# Patient Record
Sex: Male | Born: 1985 | Race: White | Hispanic: No | Marital: Married | Smoking: Former smoker
Health system: Southern US, Community
[De-identification: ages and names within clinical notes are randomized; demographics above are authoritative.]

## PROBLEM LIST (undated history)

## (undated) DIAGNOSIS — E7800 Pure hypercholesterolemia, unspecified: Secondary | ICD-10-CM

---

## 2011-03-03 HISTORY — PX: FRACTURE SURGERY: SHX138

## 2011-10-11 ENCOUNTER — Emergency Department: Payer: Self-pay | Admitting: Emergency Medicine

## 2012-12-14 ENCOUNTER — Encounter (INDEPENDENT_AMBULATORY_CARE_PROVIDER_SITE_OTHER): Payer: Self-pay

## 2012-12-14 ENCOUNTER — Encounter: Payer: Self-pay | Admitting: Internal Medicine

## 2012-12-14 ENCOUNTER — Ambulatory Visit (INDEPENDENT_AMBULATORY_CARE_PROVIDER_SITE_OTHER): Payer: PRIVATE HEALTH INSURANCE | Admitting: Internal Medicine

## 2012-12-14 VITALS — BP 116/66 | HR 78 | Temp 98.0°F | Resp 12 | Ht 71.0 in | Wt 190.0 lb

## 2012-12-14 DIAGNOSIS — R1011 Right upper quadrant pain: Secondary | ICD-10-CM

## 2012-12-14 DIAGNOSIS — Z0000 Encounter for general adult medical examination without abnormal findings: Secondary | ICD-10-CM

## 2012-12-14 DIAGNOSIS — Z113 Encounter for screening for infections with a predominantly sexual mode of transmission: Secondary | ICD-10-CM

## 2012-12-14 DIAGNOSIS — R160 Hepatomegaly, not elsewhere classified: Secondary | ICD-10-CM

## 2012-12-14 DIAGNOSIS — R5381 Other malaise: Secondary | ICD-10-CM

## 2012-12-14 DIAGNOSIS — Z23 Encounter for immunization: Secondary | ICD-10-CM

## 2012-12-14 DIAGNOSIS — Z13220 Encounter for screening for lipoid disorders: Secondary | ICD-10-CM
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
## 2012-12-14 NOTE — Patient Instructions (Signed)
We will contact you with your lab results, either by phone or via [employer] will set up your ultrasound

## 2012-12-15 LAB — COMPREHENSIVE METABOLIC PANEL
ALT: 28 U/L (ref 0–53)
AST: 29 U/L (ref 0–37)
Albumin: 4.7 g/dL (ref 3.5–5.2)
BUN: 14 mg/dL (ref 6–23)
CO2: 29 mEq/L (ref 19–32)
Calcium: 9.3 mg/dL (ref 8.4–10.5)
Chloride: 100 mEq/L (ref 96–112)
GFR: 94.57 mL/min (ref >60.00)
Potassium: 4.2 mEq/L (ref 3.5–5.1)
Sodium: 139 mEq/L (ref 135–145)
Total Protein: 7.7 g/dL (ref 6.0–8.3)

## 2012-12-15 LAB — CBC WITH DIFFERENTIAL/PLATELET
Basophils Relative: 0.2 % (ref 0.0–3.0)
Eosinophils Relative: 3.9 % (ref 0.0–5.0)
HCT: 48.2 % (ref 39.0–52.0)
Hemoglobin: 16.6 g/dL (ref 13.0–17.0)
Lymphocytes Relative: 37.7 % (ref 12.0–46.0)
Lymphs Abs: 3.5 10*3/uL (ref 0.7–4.0)
Monocytes Relative: 6.2 % (ref 3.0–12.0)
Neutro Abs: 4.8 10*3/uL (ref 1.4–7.7)
RBC: 5.59 Mil/uL (ref 4.22–5.81)

## 2012-12-15 LAB — TSH: TSH: 2.04 u[IU]/mL (ref 0.35–5.50)

## 2012-12-15 LAB — HIV ANTIBODY (ROUTINE TESTING W REFLEX): HIV: NONREACTIVE

## 2012-12-15 LAB — HEPATITIS C ANTIBODY: HCV Ab: NEGATIVE

## 2012-12-16 LAB — HSV(HERPES SIMPLEX VRS) I + II AB-IGG
HSV 1 Glycoprotein G Ab, IgG: <0.1 IV
HSV 2 Glycoprotein G Ab, IgG: <0.1 IV

## 2012-12-17 ENCOUNTER — Encounter: Payer: Self-pay | Admitting: Internal Medicine

## 2012-12-17 DIAGNOSIS — R160 Hepatomegaly, not elsewhere classified: Secondary | ICD-10-CM | POA: Insufficient documentation

## 2012-12-17 DIAGNOSIS — Z0000 Encounter for general adult medical examination without abnormal findings: Secondary | ICD-10-CM | POA: Insufficient documentation

## 2012-12-17 NOTE — Assessment & Plan Note (Signed)
Suggested by exam wil mild tenderness to palpation without guarding.  ultrasound ordered.  lfts were normal

## 2012-12-17 NOTE — Assessment & Plan Note (Signed)
  Annual male exam was done including testicular exam and check for hernias.  Screening for STDS and depressive symptoms was negative. Vaccines were brought up to date.  Healthy habits were reviewed including use of seatbelts 100% of the time , moderate alcohol consumption, regular exercise, and the  mediterranean diet.  

## 2012-12-19 ENCOUNTER — Encounter: Payer: Self-pay | Admitting: *Deleted

## 2012-12-21 ENCOUNTER — Ambulatory Visit: Payer: Self-pay | Admitting: Internal Medicine

## 2012-12-22 ENCOUNTER — Telehealth: Payer: Self-pay | Admitting: Internal Medicine

## 2012-12-22 ENCOUNTER — Encounter: Payer: Self-pay | Admitting: *Deleted

## 2012-12-22 NOTE — Telephone Encounter (Signed)
Letter mailed

## 2012-12-22 NOTE — Telephone Encounter (Signed)
Patient called for ultrasound results.

## 2012-12-22 NOTE — Telephone Encounter (Signed)
ultrasound of liver was normal

## 2012-12-23 ENCOUNTER — Encounter: Payer: Self-pay | Admitting: Internal Medicine

## 2012-12-23 NOTE — Telephone Encounter (Signed)
Ultrasound of abdomen was normal except for tiny 4mm polyp in the gallbladder .  Liver was not enlarged and GB had no gallstones

## 2012-12-23 NOTE — Telephone Encounter (Signed)
Please advise as to ultra sound results do we have results?

## 2012-12-23 NOTE — Telephone Encounter (Signed)
Patient notified of results.

## 2013-01-09 ENCOUNTER — Encounter: Payer: Self-pay | Admitting: Internal Medicine

## 2013-02-09 ENCOUNTER — Telehealth: Payer: Self-pay | Admitting: Internal Medicine

## 2013-02-09 ENCOUNTER — Ambulatory Visit (INDEPENDENT_AMBULATORY_CARE_PROVIDER_SITE_OTHER): Payer: PRIVATE HEALTH INSURANCE | Admitting: Internal Medicine

## 2013-02-09 ENCOUNTER — Encounter: Payer: Self-pay | Admitting: Internal Medicine

## 2013-02-09 VITALS — BP 128/80 | HR 77 | Wt 196.0 lb

## 2013-02-09 DIAGNOSIS — R160 Hepatomegaly, not elsewhere classified: Secondary | ICD-10-CM

## 2013-02-09 DIAGNOSIS — M7631 Iliotibial band syndrome, right leg: Secondary | ICD-10-CM

## 2013-02-09 DIAGNOSIS — M629 Disorder of muscle, unspecified: Secondary | ICD-10-CM

## 2013-02-09 MED ORDER — MELOXICAM 15 MG PO TABS: 15.0000 mg | ORAL_TABLET | Freq: Every day | ORAL | Status: DC

## 2013-02-09 NOTE — Progress Notes (Signed)
Pre visit review using our clinic review tool, if applicable. No additional management support is needed unless otherwise documented below in the visit note. 

## 2013-02-09 NOTE — Telephone Encounter (Signed)
States pharmacy does not have script for meloxicam.

## 2013-02-09 NOTE — Patient Instructions (Addendum)
1) Meloxicam 1 tablet daily,  NO ASPIRON OR IBUPROFEN..  SPORTS MEDICINE REFERRAL PENDING  2) Mediterranean diet /lifestyle and return for fasting lipids in 3 months  Iliotibial Band Syndrome Iliotibial band syndrome is pain in the outer, upper thigh. The pain is due to an inflammation (soreness) of the iliotibial band. This is a band of thick fibrous tissue that runs down the outside of the thigh. The iliotibial band begins at the hip. It extends to the outer side of the shin bone (tibia) just below the knee joint. The band works with the thigh muscles. Together they provide stability to the outside of the knee joint. Iliotibial band syndrome (ITBS) occurs when there is inflammation to this band of tissue. The irritation usually occurs over the outside of the knee joint, at the lateral epicondyle (the end of the femur bone.) The iliotibial band crosses bone and muscle at this point. Between these structures is a bursa (a cushioning sac). The bursa should make possible a smooth gliding motion. However, when inflamed, the iliotibial band does not glide easily. When inflamed, there is pain with motion of the knee. Usually the pain worsens with continued movement and the pain goes away with rest. This problem usually arises when there is a sudden increase in sports activities involving the lower extremities (your legs). Running, and playing soccer or basketball are examples of activities causing this. Others who are prone to ITBS include individuals with mechanical problems such as leg length differences, abnormality of walking, bowed legs etc. This diagnosis (learning what is wrong) is made by examination. X-rays are usually normal if only soft tissue inflammation is present. Treatment of ITBS begins with proper footwear, icing the area of pain, stretching, and resting for a period of time. Incorporating low-impact cross-training activities may also help. Your caregiver may prescribe anti-inflammatory  medications as well. HOME CARE INSTRUCTIONS   Apply ice to the sore area for 15-20 minutes, 03-04 times per day. Put the ice in a plastic bag and place a towel between the bag of ice and your skin.  Limit excessive training or eliminate training until pain goes away.  While pain is present, you may use gentle range of motion. Do not resume regular use until instructed by your caregiver. Begin use gradually. Do not increase use to the point of pain. If pain does develop, decrease use and continue the above measures. Gradually increase activities that do not cause discomfort. Do this until you finally achieve normal use.  Perform low impact activities while pain is present. Wear proper footwear.  Only take over-the-counter or prescription medicines for pain, discomfort, or fever as directed by your caregiver. SEEK MEDICAL CARE IF:   Your pain increases or pain is not controlled with medications.  You develop new, unexplained symptoms (problems), or an increase of the symptoms that brought you to your caregiver.  Your pain and symptoms are not improving or are getting worse. Document Released: 08/08/2001 Document Revised: 05/11/2011 Document Reviewed: 02/16/2005 [facility] Patient Information 2014 [place], [place]. ilIliotibial Band Syndrome with Rehab The iliotibial (IT) band is a tendon that connects the hip muscles to the shinbone (tibia) and to one of the bones of the pelvis (ileum). The IT band passes by the knee and is often irritated by the outer portion of the knee (lateral femoral condyle). A fluid filled sac (bursa) exists between the tendon and the bone, to cushion and reduce friction. Overuse of the tendon may cause excessive friction, which results in IT band  syndrome. This condition involves inflammation of the bursa (bursitis) and/or inflammation of the IT band (tendinitis). SYMPTOMS   Pain, tenderness, swelling, warmth, or redness over the IT band, at the outer knee (above the  joint).  Pain that travels up or down the thigh or leg.  Initially, pain at the beginning of an exercise, that decreases once warmed up. Eventually, pain throughout the activity, getting worse as the activity continues. May cause the athlete to stop in the middle of training or competing.  Pain that gets worse when running down hills or stairs, on banked tracks, or next to the curb on the street.  Pain that increases when the foot of the affected leg hits the ground.  Possibly, a crackling sound (crepitation) when the tendon or bursa is moved or touched. CAUSES  IT band syndrome is caused by irritation of the IT band and the underlying bursa. This eventually results in inflammation and pain. IT band syndrome is an overuse injury.  RISK INCREASES WITH:  Sports with repetitive knee-bending activities (distance running, cycling).  Incorrect training techniques, including sudden changes in the intensity, frequency, or duration of training.  Not enough rest between workouts.  Poor strength and flexibility, especially a tight IT band.  Failure to warm up properly before activity.  Bow legs.  Arthritis of the knee. PREVENTION   Warm up and stretch properly before activity.  Allow for adequate recovery between workouts.  Maintain physical fitness:  Strength, flexibility, and endurance.  Cardiovascular fitness.  Learn and use proper training technique, including reducing running mileage, shortening stride, and avoiding running on hills and banked surfaces.  Wear arch supports (orthotics), if you have flat feet. PROGNOSIS  If treated properly, IT band syndrome usually goes away within 6 weeks of treatment. RELATED COMPLICATIONS   Longer healing time, if not properly treated, or if not given enough time to heal.  Recurring inflammation of the tendon and bursa, that may result in a chronic condition.  Recurring symptoms, if activity is resumed too soon, with overuse, with a  direct blow, or with poor training technique.  Inability to complete training or competition. TREATMENT  Treatment first involves the use of ice and medicine, to reduce pain and inflammation. The use of strengthening and stretching exercises may help reduce pain with activity. These exercises may be performed at home or with a therapist. For individuals with flat feet, an arch support (orthotic) may be helpful. Some individuals find that wearing a knee sleeve or compression bandage around the knee during workouts provides some relief. Certain training techniques, such as adjusting stride length, avoiding running on hills or stairs, changing the direction you run on a circular or banked track, or changing the side of the road you run on, if you run next to the curb, may help decrease symptoms of IT band syndrome. Cyclists may need to change the seat height or foot position on their bicycles. An injection of cortisone into the bursa may be recommended. Surgery to remove the inflamed bursa and/or part of the IT band is only considered after at least 6 months of non-surgical treatment.  MEDICATION   If pain medicine is needed, nonsteroidal anti-inflammatory medicines (aspirin and ibuprofen), or other minor pain relievers (acetaminophen), are often advised.  Do not take pain medicine for 7 days before surgery.  Prescription pain relievers may be given, if your caregiver thinks they are needed. Use only as directed and only as much as you need.  Corticosteroid injections may be given  by your caregiver. These injections should be reserved for the most serious cases, because they may only be given a certain number of times. HEAT AND COLD  Cold treatment (icing) should be applied for 10 to 15 minutes every 2 to 3 hours for inflammation and pain, and immediately after activity that aggravates your symptoms. Use ice packs or an ice massage.  Heat treatment may be used before performing stretching and  strengthening activities prescribed by your caregiver, physical therapist, or athletic trainer. Use a heat pack or a warm water soak. SEEK MEDICAL CARE IF:   Symptoms get worse or do not improve in 2 to 4 weeks, despite treatment.  New, unexplained symptoms develop. (Drugs used in treatment may produce side effects.) EXERCISES  RANGE OF MOTION (ROM) AND STRETCHING EXERCISES - Iliotibial Band Syndrome These exercises may help you when beginning to rehabilitate your injury. Your symptoms may go away with or without further involvement from your physician, physical therapist or athletic trainer. While completing these exercises, remember:   Restoring tissue flexibility helps normal motion to return to the joints. This allows healthier, less painful movement and activity.  An effective stretch should be held for at least 30 seconds.  A stretch should never be painful. You should only feel a gentle lengthening or release in the stretched tissue. STRETCH - Quadriceps, Prone   Lie on your stomach on a firm surface, such as a bed or padded floor.  Bend your right / left knee and grasp your ankle. If you are unable to reach your ankle or pant leg, use a belt around your foot to lengthen your reach.  Gently pull your heel toward your buttocks. Your knee should not slide out to the side. You should feel a stretch in the front of your thigh and knee.  Hold this position for __________ seconds. Repeat __________ times. Complete this stretch __________ times per day.  STRETCH  Iliotibial Band  On the floor or bed, lie on your side, so your right / left leg is on top. Bend your knee and grab your ankle.  Slowly bring your knee back so that your thigh is in line with your trunk. Keep your heel at your buttocks and gently arch your back, so your head, shoulders and hips line up.  Slowly lower your leg so that your knee approaches the floor or bed, until you feel a gentle stretch on the outside of your  right / left thigh. If you do not feel a stretch and your knee will not fall farther, place the heel of your opposite foot on top of your knee, and pull your thigh down farther.  Hold this stretch for __________ seconds. Repeat __________ times. Complete this stretch __________ times per day. STRENGTHENING EXERCISES - Iliotibial Band Syndrome Improving the flexibility of the IT band will best relieve your discomfort due to IT band syndrome. Strengthening exercises, however, can help improve both muscle endurance and joint mechanics, reducing the factors that can contribute to this condition. Your physician, physical therapist or athletic trainer may provide you with exercises that train specific muscle groups that are especially weak. The following exercises target muscles that are often weak in people who have IT band syndrome. STRENGTH - Hip Abductors, Straight Leg Raises  Be aware of your form throughout the entire exercise, so that you exercise the correct muscles. Poor form means that you are not strengthening the correct muscles.  Lie on your side, so that your head, shoulders, knee and  hip line up. You may bend your lower knee to help maintain your balance. Your right / left leg should be on top.  Roll your hips slightly forward, so that your hips are stacked directly over each other and your right / left knee is facing forward.  Lift your top leg up 4-6 inches, leading with your heel. Be sure that your foot does not drift forward and that your knee does not roll toward the ceiling.  Hold this position for __________ seconds. You should feel the muscles in your outer hip lifting (you may not notice this until your leg begins to tire).  Slowly lower your leg to the starting position. Allow the muscles to fully relax before beginning the next repetition. Repeat __________ times. Complete this exercise __________ times per day.  STRENGTH - Quad/VMO, Isometric  Sit in a chair with your right  / left knee slightly bent. With your fingertips, feel the VMO muscle (just above the inside of your knee). The VMO is important in controlling the position of your kneecap.  Keeping your fingertips on this muscle. Without actually moving your leg, attempt to drive your knee down, as if straightening your leg. You should feel your VMO tense. If you have a difficult time, you may wish to try the same exercise on your healthy knee first.  Tense this muscle as hard as you can, without increasing any knee pain.  Hold for __________ seconds. Relax the muscles slowly and completely between each repetition. Repeat __________ times. Complete this exercise __________ times per day.  Document Released: 02/16/2005 Document Revised: 05/11/2011 Document Reviewed: 05/31/2008 [facility] Patient Information 2014 [place], [place].

## 2013-02-09 NOTE — Telephone Encounter (Signed)
Medication sent electronically and patient notified.

## 2013-02-09 NOTE — Progress Notes (Signed)
Patient ID: Gilbert Cook, male   DOB: 05-Aug-1985, 27 y.o.   MRN: [id number]  Patient Active Problem List   Diagnosis Date Noted  . Iliotibial band syndrome of right side 02/11/2013  . Encounter for preventive health examination 12/17/2012  . Hepatomegaly 12/17/2012    Subjective:  CC:   Chief Complaint  Patient presents with  . Knee Pain    HPI:   Gilbert Cook a 27 y.o. male who presents [facility] K thanksgiving morning,  Spent 3 days in bed afterward because of myalgias includin right lateral thigh which was  Tender note red and not bruised  Went on cruiese dec 2 to 7   ltos of [place],  [employer].  Lots food and drink.  Thigh still hurting,  Now has a bruise on the thigh,    No past medical history on file.  Past Surgical History  Procedure Laterality Date  . Fracture surgery Right 2013    wrist       The following portions of the patient's history were reviewed and updated as appropriate: Allergies, current medications, and problem list.    Review of Systems:   12 Pt  review of systems was negative except those addressed in the HPI,     History   Social History  . Marital Status: Single    Spouse Name: N/A    Number of Children: N/A  . Years of Education: N/A   Occupational History  . Not on file.   Social History Main Topics  . Smoking status: Former Smoker -- 0.50 packs/day for 5 years    Types: Cigarettes    Quit date: 12/15/2010  . Smokeless tobacco: Never Used  . Alcohol Use: Yes  . Drug Use: Yes    Special: Marijuana  . Sexual Activity: Yes   Other Topics Concern  . Not on file   Social History Narrative  . No narrative on file    Objective:  Filed Vitals:   02/09/13 0810  BP: 128/80  Pulse: 77     General appearance: alert, cooperative and appears stated age Ears: normal TM's and external ear canals both ears Throat: lips, mucosa, and tongue normal; teeth and gums normal Neck: no adenopathy, no carotid bruit, supple,  symmetrical, trachea midline and thyroid not enlarged, symmetric, no tenderness/mass/nodules Back: symmetric, no curvature. ROM normal. No CVA tenderness. Lungs: clear to auscultation bilaterally Heart: regular rate and rhythm, S1, S2 normal, no murmur, click, rub or gallop Abdomen: soft, non-tender; bowel sounds normal; no masses,  no organomegaly Pulses: 2+ and symmetric Skin: Skin color, texture, turgor normal. No rashes or lesions Lymph nodes: Cervical, supraclavicular, and axillary nodes normal.  Assessment and Plan:  Iliotibial band syndrome of right side Supportive care advised.   Hepatomegaly Ultrasound of abdomen was normal except for tiny 4mm polyp in the gallbladder .  Liver was not enlarged and GB had no gallstones     Updated Medication List Outpatient Encounter Prescriptions as of 02/09/2013  Medication Sig  . Ascorbic Acid (VITAMIN C) 100 MG tablet Take 100 mg by mouth daily.  Marland Kitchen thiamine (VITAMIN B-1) 100 MG tablet Take 100 mg by mouth daily.  . Vitamin D, Ergocalciferol, (DRISDOL) 50000 UNITS CAPS capsule Take 50,000 Units by mouth every 7 (seven) days.  . [DISCONTINUED] meloxicam (MOBIC) 15 MG tablet Take 1 tablet (15 mg total) by mouth daily.     Orders Placed This Encounter  Procedures  . Ambulatory referral to Sports Medicine  No Follow-up on file.

## 2013-02-11 DIAGNOSIS — M7631 Iliotibial band syndrome, right leg: Secondary | ICD-10-CM | POA: Insufficient documentation

## 2013-02-11 NOTE — Assessment & Plan Note (Signed)
Supportive care advised.

## 2013-02-11 NOTE — Assessment & Plan Note (Signed)
Ultrasound of abdomen was normal except for tiny 4mm polyp in the gallbladder .  Liver was not enlarged and GB had no gallstones   

## 2013-02-14 ENCOUNTER — Encounter: Payer: Self-pay | Admitting: Emergency Medicine

## 2013-03-15 ENCOUNTER — Encounter: Payer: Self-pay | Admitting: Internal Medicine

## 2013-03-15 ENCOUNTER — Ambulatory Visit (INDEPENDENT_AMBULATORY_CARE_PROVIDER_SITE_OTHER): Payer: PRIVATE HEALTH INSURANCE | Admitting: Internal Medicine

## 2013-03-15 VITALS — BP 130/76 | HR 84 | Temp 98.0°F | Resp 16 | Wt 193.8 lb

## 2013-03-15 DIAGNOSIS — F411 Generalized anxiety disorder: Secondary | ICD-10-CM

## 2013-03-15 DIAGNOSIS — M629 Disorder of muscle, unspecified: Secondary | ICD-10-CM

## 2013-03-15 DIAGNOSIS — M7631 Iliotibial band syndrome, right leg: Secondary | ICD-10-CM

## 2013-03-15 DIAGNOSIS — M2420 Disorder of ligament, unspecified site: Secondary | ICD-10-CM

## 2013-03-15 MED ORDER — ALPRAZOLAM 0.5 MG PO TABS
0.5000 mg | ORAL_TABLET | Freq: Every evening | ORAL | Status: DC | PRN
Start: 2013-03-15 — End: 2013-03-29

## 2013-03-15 NOTE — Progress Notes (Signed)
Pre-visit discussion using our clinic review tool. No additional management support is needed unless otherwise documented below in the visit note.  

## 2013-03-15 NOTE — Progress Notes (Signed)
Patient ID: Gilbert Cook, male   DOB: [birth year], 28 y.o.   MRN: [id number]  Patient Active Problem List   Diagnosis Date Noted  . Generalized anxiety disorder 03/18/2013  . Iliotibial band syndrome of right side 02/11/2013  . Encounter for preventive health examination 12/17/2012  . Hepatomegaly 12/17/2012    Subjective:  CC:   Chief Complaint  Patient presents with  . Follow-up    anxiety and depression issue's    HPI:   Gilbert Cook a 28 y.o. male who presents Followup on Anxiety and depression .    He has been having increased trouble with anxiety for the last several weeks. Treatment included several family stressors. His Father died of leukemia in 2022-07-28 , and his Maternal uncle committed suicide the week before.  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 His work schedule is quite rigorous. He works 40 hours a week from 95 and then one week every month he is on call 24 7 and has to take phone calls and service calls at all hours of the night. [place] he is constantly scrutinized  by management .Gets overwhemed easily and shuts down.,  [employer] train of thought.  Then gets depressed,  having Anhedonia.. No suicidal thoughts. Accompanied by his wife who does most of the talking for him. There are no observed or distort episodes of manic behavior.  2) still having a significant right lateral thigh pain after increase in  physical activity He was referred to sports therapy for iliotibial band syndrome but did not go because he was concerned about the co-pay. His pain has not progressed but it has not improved as he had hoped. He remains physically very active.     No past medical history on file.  Past Surgical History  Procedure Laterality Date  . Fracture surgery Right 2013    wrist       The following portions of the patient's history were reviewed and updated as appropriate: Allergies, current medications, and problem  list.    Review of Systems:   12 Pt  review of systems was negative except those addressed in the HPI,     History   Social History  . Marital Status: Single    Spouse Name: N/A    Number of Children: N/A  . Years of Education: N/A   Occupational History  . Not on file.   Social History Main Topics  . Smoking status: Former Smoker -- 0.50 packs/day for 5 years    Types: Cigarettes    Quit date: 12/15/2010  . Smokeless tobacco: Never Used  . Alcohol Use: Yes  . Drug Use: Yes    Special: Marijuana  . Sexual Activity: Yes   Other Topics Concern  . Not on file   Social History Narrative  . No narrative on file    Objective:  Filed Vitals:   03/15/13 1457  BP: 130/76  Pulse: 84  Temp: 98 F (36.7 C)  Resp: 16     General appearance: alert, cooperative and appears stated age Ears: normal TM's and external ear canals both ears Throat: lips, mucosa, and tongue normal; teeth and gums normal Neck: no adenopathy, no carotid bruit, supple, symmetrical, trachea midline and thyroid not enlarged, symmetric, no tenderness/mass/nodules Back: symmetric, no curvature. ROM normal. No CVA tenderness. Lungs: clear to auscultation bilaterally Heart: regular rate and rhythm, S1, S2 normal, no murmur, click, rub or gallop Abdomen: soft, non-tender;  bowel sounds normal; no masses,  no organomegaly Pulses: 2+ and symmetric Skin: Skin color, texture, turgor normal. No rashes or lesions Lymph nodes: Cervical, supraclavicular, and axillary nodes normal.  Assessment and Plan:  Iliotibial band syndrome of right side Reassurance provided that his failure to improve is likely due to his failure to modify his physical activity. I have again offered physical therapy versus sports  medicine referral. Continue use of nonsteroidal anti-inflammatory and stretching.  Generalized anxiety disorder We discussed the nature of his anxiety and the triggers that are aggravating it currently.  Spent a long time discussing the various medications that are used to treat anxiety including benzodiazepines SSRIs and atypical medications. After lengthy discussion we decided to start a trial of BuSpar to avoid using benzodiazepines multiple times daily. I've also given her a prescription for alprazolam to use at night to help him rest. He will return in one month.   A total of 40 minutes was spent with patient more than half of which was spent in counseling, and coordination of care.  Updated Medication List Outpatient Encounter Prescriptions as of 03/15/2013  Medication Sig  . meloxicam (MOBIC) 15 MG tablet Take 1 tablet (15 mg total) by mouth daily.  . Multiple Vitamin (MULTIVITAMIN) tablet Take 1 tablet by mouth daily.  Marland Kitchen ALPRAZolam (XANAX) 0.5 MG tablet Take 1 tablet (0.5 mg total) by mouth at bedtime as needed for anxiety.  . [DISCONTINUED] Ascorbic Acid (VITAMIN C) 100 MG tablet Take 100 mg by mouth daily.  . [DISCONTINUED] thiamine (VITAMIN B-1) 100 MG tablet Take 100 mg by mouth daily.  . [DISCONTINUED] Vitamin D, Ergocalciferol, (DRISDOL) 50000 UNITS CAPS capsule Take 50,000 Units by mouth every 7 (seven) days.     No orders of the defined types were placed in this encounter.    No Follow-up on file.

## 2013-03-15 NOTE — Patient Instructions (Signed)
Trial of alprazolam (Xanax) for anxiety and early morning wakening  If you find you are needing to use it more often ,  We should consider a trila of buspirone  Try adding tylenol 500 mg every 6 hours as needed for your thigh pain

## 2013-03-18 DIAGNOSIS — F411 Generalized anxiety disorder: Secondary | ICD-10-CM | POA: Insufficient documentation

## 2013-03-18 NOTE — Assessment & Plan Note (Signed)
We discussed the nature of his anxiety and the triggers that are aggravating it currently. Spent a long time discussing the various medications that are used to treat anxiety including benzodiazepines SSRIs and atypical medications. After lengthy discussion we decided to start a trial of BuSpar to avoid using benzodiazepines multiple times daily. I've also given her a prescription for alprazolam to use at night to help him rest. He will return in one month.

## 2013-03-18 NOTE — Assessment & Plan Note (Signed)
Reassurance provided that his failure to improve is likely due to his failure to modify his physical activity. I have again offered physical therapy versus sports  medicine referral. Continue use of nonsteroidal anti-inflammatory and stretching.

## 2013-03-27 ENCOUNTER — Telehealth: Payer: Self-pay | Admitting: Internal Medicine

## 2013-03-27 NOTE — Telephone Encounter (Signed)
Is taking alprazolam 1/2 pill twice a day right now.  Eating a lot at night (cookies).  States they were told if he was having to take it more to let doctor know and med may change.

## 2013-03-29 MED ORDER — BUSPIRONE HCL 7.5 MG PO TABS: 7.5000 mg | ORAL_TABLET | Freq: Three times a day (TID) | ORAL | Status: DC

## 2013-03-29 MED ORDER — ALPRAZOLAM 0.5 MG PO TABS: 0.5000 mg | ORAL_TABLET | Freq: Every evening | ORAL | Status: DC | PRN

## 2013-03-29 NOTE — Telephone Encounter (Signed)
I have authorized Alprazolam refilled for once daily use but I want him to start buspirone 7.5 mg bid,  per prior discussion.  The  rx for buspirone was sent. He can add a third dose of buspirone for evening if needed.  Make follow up appt 1 month

## 2013-03-29 NOTE — Telephone Encounter (Signed)
Left message for pt to return my call.

## 2013-03-29 NOTE — Telephone Encounter (Signed)
Pt notified and verbalized understanding. Rx faxed to pharmacy

## 2013-03-29 NOTE — Telephone Encounter (Signed)
That patient calling needing a response to the message from 1.26.15.

## 2013-09-14 ENCOUNTER — Encounter: Payer: Self-pay | Admitting: Internal Medicine

## 2013-09-14 ENCOUNTER — Ambulatory Visit (INDEPENDENT_AMBULATORY_CARE_PROVIDER_SITE_OTHER): Payer: BC Managed Care – PPO | Admitting: Internal Medicine

## 2013-09-14 VITALS — BP 126/70 | HR 66 | Temp 98.0°F | Resp 16 | Ht 71.0 in | Wt 178.8 lb

## 2013-09-14 DIAGNOSIS — M791 Myalgia, unspecified site: Secondary | ICD-10-CM

## 2013-09-14 DIAGNOSIS — M629 Disorder of muscle, unspecified: Secondary | ICD-10-CM

## 2013-09-14 DIAGNOSIS — F411 Generalized anxiety disorder: Secondary | ICD-10-CM

## 2013-09-14 DIAGNOSIS — R252 Cramp and spasm: Secondary | ICD-10-CM

## 2013-09-14 DIAGNOSIS — IMO0001 Reserved for inherently not codable concepts without codable children: Secondary | ICD-10-CM

## 2013-09-14 DIAGNOSIS — M7631 Iliotibial band syndrome, right leg: Secondary | ICD-10-CM

## 2013-09-14 DIAGNOSIS — M2420 Disorder of ligament, unspecified site: Secondary | ICD-10-CM

## 2013-09-14 LAB — COMPREHENSIVE METABOLIC PANEL
ALT: 21 U/L (ref 0–53)
AST: 26 U/L (ref 0–37)
Albumin: 4.5 g/dL (ref 3.5–5.2)
Alkaline Phosphatase: 57 U/L (ref 39–117)
BILIRUBIN TOTAL: 0.9 mg/dL (ref 0.2–1.2)
BUN: 13 mg/dL (ref 6–23)
CO2: 30 mEq/L (ref 19–32)
CREATININE: 1.1 mg/dL (ref 0.4–1.5)
Calcium: 9.6 mg/dL (ref 8.4–10.5)
Chloride: 102 mEq/L (ref 96–112)
GFR: 85.11 mL/min (ref >60.00)
GLUCOSE: 92 mg/dL (ref 70–99)
Potassium: 4.9 mEq/L (ref 3.5–5.1)
SODIUM: 137 meq/L (ref 135–145)
TOTAL PROTEIN: 7.1 g/dL (ref 6.0–8.3)

## 2013-09-14 LAB — CK: Total CK: 297 U/L — ABNORMAL HIGH (ref 7–232)

## 2013-09-14 MED ORDER — DICLOFENAC SODIUM 75 MG PO TBEC: 75.0000 mg | DELAYED_RELEASE_TABLET | Freq: Two times a day (BID) | ORAL | Status: DC

## 2013-09-14 MED ORDER — CYCLOBENZAPRINE HCL 10 MG PO TABS: 10.0000 mg | ORAL_TABLET | Freq: Three times a day (TID) | ORAL | Status: DC | PRN

## 2013-09-14 NOTE — Progress Notes (Signed)
Patient ID: Gilbert Cook, male   DOB: 25-Dec-1985, 28 y.o.   MRN: [id number]   Patient Active Problem List   Diagnosis Date Noted  . Muscle cramps 09/16/2013  . Generalized anxiety disorder 03/18/2013  . Iliotibial band syndrome of right side 02/11/2013  . Encounter for preventive health examination 12/17/2012  . Hepatomegaly 12/17/2012    Subjective:  CC:   Chief Complaint  Patient presents with  . Follow-up    muscle cramps in legs.  pain in bilateral legs that comes and goes.    HPI:   Gilbert Cook is a 28 y.o. male who presents for Follow up on anxiety.  He was last seen in in January and was started on buspirone and qhs alprazolam. He is not taking taking either anymore   He has been having muscle cramps highs in the quads and biceps femoris.,  Occurring with activity  ,  Knees also popping      History reviewed. No pertinent past medical history.  Past Surgical History  Procedure Laterality Date  . Fracture surgery Right 2013    wrist       The following portions of the patient's history were reviewed and updated as appropriate: Allergies, current medications, and problem list.    Review of Systems:   Patient denies headache, fevers, malaise, unintentional weight loss, skin rash, eye pain, sinus congestion and sinus pain, sore throat, dysphagia,  hemoptysis , cough, dyspnea, wheezing, chest pain, palpitations, orthopnea, edema, abdominal pain, nausea, melena, diarrhea, constipation, flank pain, dysuria, hematuria, urinary  Frequency, nocturia, numbness, tingling, seizures,  Focal weakness, Loss of consciousness,  Tremor, insomnia, depression, anxiety, and suicidal ideation.     History   Social History  . Marital Status: Single    Spouse Name: N/A    Number of Children: N/A  . Years of Education: N/A   Occupational History  . Not on file.   Social History Main Topics  . Smoking status: Former Smoker -- 0.50 packs/day for 5 years    Types:  Cigarettes    Quit date: 12/15/2010  . Smokeless tobacco: Never Used  . Alcohol Use: Yes  . Drug Use: Yes    Special: Marijuana  . Sexual Activity: Yes   Other Topics Concern  . Not on file   Social History Narrative  . No narrative on file    Objective:  Filed Vitals:   09/14/13 1015  BP: 126/70  Pulse: 66  Temp: 98 F (36.7 C)  Resp: 16     General appearance: alert, cooperative and appears stated age Ears: normal TM's and external ear canals both ears Throat: lips, mucosa, and tongue normal; teeth and gums normal Neck: no adenopathy, no carotid bruit, supple, symmetrical, trachea midline and thyroid not enlarged, symmetric, no tenderness/mass/nodules Back: symmetric, no curvature. ROM normal. No CVA tenderness. Lungs: clear to auscultation bilaterally Heart: regular rate and rhythm, S1, S2 normal, no murmur, click, rub or gallop Abdomen: soft, non-tender; bowel sounds normal; no masses,  no organomegaly Pulses: 2+ and symmetric Skin: Skin color, texture, turgor normal. No rashes or lesions Lymph nodes: Cervical, supraclavicular, and axillary nodes normal.  Assessment and Plan:  Generalized anxiety disorder Currently untreated per patient preference.  Muscle cramps With mild ck elevation. likely due to mild dehydration. Discussed need to use electrolyte replacemement drinks/    Lab Results  Component Value Date   CKTOTAL 297* 09/14/2013    A total of 25 minutes was spent with patient more than  half of which was spent in counseling, reviewing records from other prviders and coordination of care.   Updated Medication List Outpatient Encounter Prescriptions as of 09/14/2013  Medication Sig  . ALPRAZolam (XANAX) 0.5 MG tablet Take 1 tablet (0.5 mg total) by mouth at bedtime as needed for anxiety.  . busPIRone (BUSPAR) 7.5 MG tablet Take 1 tablet (7.5 mg total) by mouth 3 (three) times daily.  . cyclobenzaprine (FLEXERIL) 10 MG tablet Take 1 tablet (10 mg  total) by mouth 3 (three) times daily as needed for muscle spasms.  . diclofenac (VOLTAREN) 75 MG EC tablet Take 1 tablet (75 mg total) by mouth 2 (two) times daily. For muscle cramps/pain  . meloxicam (MOBIC) 15 MG tablet Take 1 tablet (15 mg total) by mouth daily.  . Multiple Vitamin (MULTIVITAMIN) tablet Take 1 tablet by mouth daily.     Orders Placed This Encounter  Procedures  . CK  . Comp Met (CMET)  . Ambulatory referral to Sports Medicine    No Follow-up on file.

## 2013-09-14 NOTE — Patient Instructions (Signed)

## 2013-09-14 NOTE — Progress Notes (Signed)
Pre-visit discussion using our clinic review tool. No additional management support is needed unless otherwise documented below in the visit note.  

## 2013-09-16 ENCOUNTER — Encounter: Payer: Self-pay | Admitting: Internal Medicine

## 2013-09-16 DIAGNOSIS — R252 Cramp and spasm: Secondary | ICD-10-CM | POA: Insufficient documentation

## 2013-09-16 NOTE — Assessment & Plan Note (Signed)
With mild ck elevation. likely due to mild dehydration. Discussed need to use electrolyte replacemement drinks/    Lab Results  Component Value Date   CKTOTAL 297* 09/14/2013

## 2013-09-16 NOTE — Assessment & Plan Note (Signed)
Currently untreated per patient preference.

## 2013-09-17 ENCOUNTER — Encounter: Payer: Self-pay | Admitting: Internal Medicine

## 2013-09-19 NOTE — Telephone Encounter (Signed)
Mailed unread message to pt  

## 2013-09-29 ENCOUNTER — Ambulatory Visit: Payer: BC Managed Care – PPO | Admitting: Family Medicine

## 2013-10-09 ENCOUNTER — Other Ambulatory Visit (INDEPENDENT_AMBULATORY_CARE_PROVIDER_SITE_OTHER): Payer: BC Managed Care – PPO

## 2013-10-09 ENCOUNTER — Ambulatory Visit (INDEPENDENT_AMBULATORY_CARE_PROVIDER_SITE_OTHER): Payer: BC Managed Care – PPO | Admitting: Family Medicine

## 2013-10-09 ENCOUNTER — Ambulatory Visit (INDEPENDENT_AMBULATORY_CARE_PROVIDER_SITE_OTHER)
Admission: RE | Admit: 2013-10-09 | Discharge: 2013-10-09 | Disposition: A | Discharge status: Home / Self Care | Payer: BC Managed Care – PPO | Source: Ambulatory Visit | Attending: Family Medicine | Admitting: Family Medicine

## 2013-10-09 ENCOUNTER — Encounter: Payer: Self-pay | Admitting: Family Medicine

## 2013-10-09 VITALS — BP 147/84 | HR 70 | Ht 72.0 in | Wt 183.0 lb

## 2013-10-09 DIAGNOSIS — M25569 Pain in unspecified knee: Secondary | ICD-10-CM

## 2013-10-09 DIAGNOSIS — M25561 Pain in right knee: Secondary | ICD-10-CM

## 2013-10-09 DIAGNOSIS — M6281 Muscle weakness (generalized): Secondary | ICD-10-CM | POA: Insufficient documentation

## 2013-10-09 NOTE — Progress Notes (Signed)
  [facility] [address], [place] [address] Phone: [phone] Subjective:    I'm seeing this patient by the request  of:  TULLO,TERESA, MD   CC: Right knee and calf pain  Gilbert Cook is a 28 y.o. male coming in with complaint of knee and calf pain. Patient states that he started to run a little bit more. Patient states that he has more of a discomfort that happens after he runs. Patient states it usually is on the lateral aspect of his quadriceps and going down to his calf. States that is bilateral but the  right is worse than the left. Denies any radiation of pain or any numbness. Patient states that it does occurs with certain activities. States that regular daily activities to did not seem to make concerning. Denies any nighttime awakening. Does respond to anti-inflammatories as well as icing. States that the severity is 5/10.     Past medical history, social, surgical and family history all reviewed in electronic medical record.   Review of Systems: No headache, visual changes, nausea, vomiting, diarrhea, constipation, dizziness, abdominal pain, skin rash, fevers, chills, night sweats, weight loss, swollen lymph nodes, body aches, joint swelling, muscle aches, chest pain, shortness of breath, mood changes.   Objective Blood pressure 147/84, pulse 70, height 6' (1.829 m), weight 183 lb (83.008 kg).  General: No apparent distress alert and oriented x3 mood and affect normal, dressed appropriately.  HEENT: Pupils equal, extraocular movements intact  Respiratory: Patient's speak in full sentences and does not appear short of breath  Cardiovascular: No lower extremity edema, non tender, no erythema  Skin: Warm dry intact with no signs of infection or rash on extremities or on axial skeleton.  Abdomen: Soft nontender  Neuro: Cranial nerves II through XII are intact, neurovascularly intact in all extremities with 2+ DTRs and 2+  pulses.  Lymph: No lymphadenopathy of posterior or anterior cervical chain or axillae bilaterally.  Gait normal with good balance and coordination.  MSK:  Non tender with full range of motion and good stability and symmetric strength and tone of shoulders, elbows, wrist,  hip, and ankles bilaterally.  Knee: Right knee Normal to inspection with no erythema or effusion or obvious bony abnormalities. Patient does have mild atrophy of the quadriceps compared to the contralateral leg Palpation normal with no warmth, joint line tenderness, patellar tenderness, or condyle tenderness. ROM full in flexion and extension and lower leg rotation. Ligaments with solid consistent endpoints including ACL, PCL, LCL, MCL. Negative Mcmurray's, Apley's, and Thessalonian tests. Non painful patellar compression. Patellar glide without crepitus. Patellar and quadriceps tendons unremarkable. Hamstring and quadriceps strength is normal.  Contralateral knee unremarkable  MSK US performed of: Right knee This study was ordered, performed, and interpreted by Charlann Boxer D.O.  Knee: All structures visualized. Anteromedial, anterolateral, posteromedial, and posterolateral menisci unremarkable without tearing, fraying, effusion, or displacement. Patient though does have some mild spurring noted of the lateral joint line of the right knee Patellar Tendon unremarkable on long and transverse views without effusion. No abnormality of prepatellar bursa. LCL and MCL unremarkable on long and transverse views. No abnormality of origin of medial or lateral head of the gastrocnemius.  IMPRESSION:  Mild bone spurring of the right knee on the lateral joint line otherwise unremarkable.      Impression and Recommendations:     This case required medical decision making of moderate complexity.

## 2013-10-09 NOTE — Assessment & Plan Note (Signed)
Patient does have weakness of the vastus medialis oblique muscle and we are going to focus on strengthening. I think patient is having overcompensating of the vastus lateralis and the iliotibial band and is causing the discomfort. Also patient's lateral calf muscle is probably giving him trouble. Patient given home exercises focusing on strengthening the quadriceps and stretching the calf. We discussed icing regimen as well as the exercises. Patient will also try some over-the-counter medications. Discuss that patient brought shoes we may be able to do gait analysis which could be beneficial. Patient will try compression sleeve with running. Patient and will come back in 3 weeks for further evaluation and treatment.

## 2013-10-09 NOTE — Patient Instructions (Signed)
Good to meet you Ice 20 minutes 2 times daily. Usually after activity and before bed. Exercises 3 times a week.  Focus on strengthening quads and stretching calfs.  Compression sleeve on calf with activity.  Vitamin D 2000 IU daily.  Turmeric 500mg  twice daily.  Come back in 3 weeks.

## 2013-10-30 ENCOUNTER — Ambulatory Visit: Payer: BC Managed Care – PPO | Admitting: Family Medicine

## 2013-10-30 DIAGNOSIS — Z0289 Encounter for other administrative examinations: Secondary | ICD-10-CM

## 2013-10-31 ENCOUNTER — Telehealth: Payer: Self-pay | Admitting: Family Medicine

## 2013-10-31 NOTE — Telephone Encounter (Signed)
Patient no showed for 3 week fu 8/31.  Please advise.

## 2013-10-31 NOTE — Telephone Encounter (Signed)
Noted  

## 2015-05-27 DIAGNOSIS — R160 Hepatomegaly, not elsewhere classified: Secondary | ICD-10-CM | POA: Insufficient documentation

## 2015-05-27 DIAGNOSIS — Z87891 Personal history of nicotine dependence: Secondary | ICD-10-CM | POA: Diagnosis not present

## 2015-05-27 DIAGNOSIS — K8020 Calculus of gallbladder without cholecystitis without obstruction: Secondary | ICD-10-CM | POA: Diagnosis not present

## 2015-05-27 DIAGNOSIS — R1011 Right upper quadrant pain: Secondary | ICD-10-CM | POA: Diagnosis present

## 2015-05-27 LAB — URINALYSIS COMPLETE WITH MICROSCOPIC (ARMC ONLY)
BILIRUBIN URINE: NEGATIVE
Glucose, UA: NEGATIVE mg/dL
HGB URINE DIPSTICK: NEGATIVE
KETONES UR: NEGATIVE mg/dL
LEUKOCYTES UA: NEGATIVE
NITRITE: NEGATIVE
PH: 7 (ref 5.0–8.0)
PROTEIN: 100 mg/dL — AB
SPECIFIC GRAVITY, URINE: 1.01 (ref 1.005–1.030)
SQUAMOUS EPITHELIAL / LPF: NONE SEEN

## 2015-05-27 LAB — COMPREHENSIVE METABOLIC PANEL
ALT: 24 U/L (ref 17–63)
ANION GAP: 6 (ref 5–15)
AST: 26 U/L (ref 15–41)
Albumin: 4.4 g/dL (ref 3.5–5.0)
Alkaline Phosphatase: 51 U/L (ref 38–126)
BUN: 15 mg/dL (ref 6–20)
CHLORIDE: 101 mmol/L (ref 101–111)
CO2: 28 mmol/L (ref 22–32)
Calcium: 8.8 mg/dL — ABNORMAL LOW (ref 8.9–10.3)
Creatinine, Ser: 1.06 mg/dL (ref 0.61–1.24)
GFR calc Af Amer: >60 mL/min (ref >60)
Glucose, Bld: 103 mg/dL — ABNORMAL HIGH (ref 65–99)
POTASSIUM: 3.4 mmol/L — AB (ref 3.5–5.1)
Sodium: 135 mmol/L (ref 135–145)
TOTAL PROTEIN: 6.7 g/dL (ref 6.5–8.1)
Total Bilirubin: 1.2 mg/dL (ref 0.3–1.2)

## 2015-05-27 LAB — CBC
HEMATOCRIT: 43 % (ref 40.0–52.0)
HEMOGLOBIN: 14.7 g/dL (ref 13.0–18.0)
MCH: 29.1 pg (ref 26.0–34.0)
MCHC: 34.1 g/dL (ref 32.0–36.0)
MCV: 85.1 fL (ref 80.0–100.0)
Platelets: 213 10*3/uL (ref 150–440)
RBC: 5.05 MIL/uL (ref 4.40–5.90)
RDW: 13.3 % (ref 11.5–14.5)
WBC: 9.1 10*3/uL (ref 3.8–10.6)

## 2015-05-27 LAB — LIPASE, BLOOD: LIPASE: 26 U/L (ref 11–51)

## 2015-05-27 NOTE — ED Notes (Signed)
Pt in with co ruq pain was told a few years ago he had inflamed gallbladder.  States gets the same pain intermittently, has had nausea no diarrhea.

## 2015-05-28 ENCOUNTER — Emergency Department: Payer: BLUE CROSS/BLUE SHIELD

## 2015-05-28 ENCOUNTER — Emergency Department
Admission: EM | Admit: 2015-05-28 | Discharge: 2015-05-28 | Disposition: A | Discharge status: Home / Self Care | Payer: BLUE CROSS/BLUE SHIELD | Attending: Emergency Medicine | Admitting: Emergency Medicine

## 2015-05-28 DIAGNOSIS — K8020 Calculus of gallbladder without cholecystitis without obstruction: Secondary | ICD-10-CM

## 2015-05-28 DIAGNOSIS — R52 Pain, unspecified: Secondary | ICD-10-CM

## 2015-05-28 MED ORDER — ONDANSETRON 4 MG PO TBDP: 4.0000 mg | ORAL_TABLET | Freq: Three times a day (TID) | ORAL | Status: DC | PRN

## 2015-05-28 MED ORDER — OXYCODONE-ACETAMINOPHEN 5-325 MG PO TABS: 1.0000 | ORAL_TABLET | ORAL | Status: DC | PRN

## 2015-05-28 NOTE — ED Notes (Signed)

## 2015-05-28 NOTE — Discharge Instructions (Signed)

## 2015-05-28 NOTE — ED Provider Notes (Signed)
[facility] Emergency Department Provider Note  ____________________________________________  Time seen: 1:15 AM  I have reviewed the triage vital signs and the nursing notes.   HISTORY  Chief Complaint Abdominal Pain     HPI Gilbert Cook is a 30 y.o. male presents with right upper quadrant pain times a few years with acute worsening tonight following eating dinner. Patient states the pain was associated with nausea. Patient denies any fever. Patient temperature presentation to emergency department 98.2. Patient states that he was informed 2 years ago that he had gallstones.     No past medical history on file.  Patient Active Problem List   Diagnosis Date Noted  . Quadriceps weakness 10/09/2013  . Muscle cramps 09/16/2013  . Generalized anxiety disorder 03/18/2013  . Iliotibial band syndrome of right side 02/11/2013  . Encounter for preventive health examination 12/17/2012  . Hepatomegaly 12/17/2012    Past Surgical History  Procedure Laterality Date  . Fracture surgery Right 2013    wrist    Current Outpatient Rx  Name  Route  Sig  Dispense  Refill  . ALPRAZolam (XANAX) 0.5 MG tablet   Oral   Take 1 tablet (0.5 mg total) by mouth at bedtime as needed for anxiety.   30 tablet   0   . busPIRone (BUSPAR) 7.5 MG tablet   Oral   Take 1 tablet (7.5 mg total) by mouth 3 (three) times daily.   90 tablet   1   . cyclobenzaprine (FLEXERIL) 10 MG tablet   Oral   Take 1 tablet (10 mg total) by mouth 3 (three) times daily as needed for muscle spasms.   30 tablet   2   . diclofenac (VOLTAREN) 75 MG EC tablet   Oral   Take 1 tablet (75 mg total) by mouth 2 (two) times daily. For muscle cramps/pain   60 tablet   3   . meloxicam (MOBIC) 15 MG tablet   Oral   Take 1 tablet (15 mg total) by mouth daily.   30 tablet   2   . Multiple Vitamin (MULTIVITAMIN) tablet   Oral   Take 1 tablet by mouth daily.           Allergies No  known drug allergies  Family History  Problem Relation Age of Onset  . Hypertension Mother   . Hypertension Father   . Cancer Father 9    T cell leukemia  . Depression Maternal Uncle   . Schizophrenia Maternal Uncle   . Hypertension Maternal Grandmother   . Hypertension Maternal Grandfather   . Hypertension Paternal Grandmother   . Hypertension Paternal Grandfather     Social History Social History  Substance Use Topics  . Smoking status: Former Smoker -- 0.50 packs/day for 5 years    Types: Cigarettes    Quit date: 12/15/2010  . Smokeless tobacco: Never Used  . Alcohol Use: Yes    Review of Systems  Constitutional: Negative for fever. Eyes: Negative for visual changes. ENT: Negative for sore throat. Cardiovascular: Negative for chest pain. Respiratory: Negative for shortness of breath. Gastrointestinal: Positive for abdominal pain and nausea Genitourinary: Negative for dysuria. Musculoskeletal: Negative for back pain. Skin: Negative for rash. Neurological: Negative for headaches, focal weakness or numbness.   10-point ROS otherwise negative.  ____________________________________________   PHYSICAL EXAM:  VITAL SIGNS: ED Triage Vitals  Enc Vitals Group     BP 05/27/15 2153 110/93 mmHg     Pulse Rate 05/27/15  2153 57     Resp 05/27/15 2153 18     Temp 05/27/15 2153 98.2 F (36.8 C)     Temp Source 05/27/15 2153 Oral     SpO2 05/27/15 2153 97 %     Weight 05/27/15 2153 180 lb (81.647 kg)     Height 05/27/15 2153 6' (1.829 m)     Head Cir --      Peak Flow --      Pain Score 05/27/15 2154 4     Pain Loc --      Pain Edu? --      Excl. in [place]? --    Constitutional: Alert and oriented. Well appearing and in no distress. Eyes: Conjunctivae are normal. PERRL. Normal extraocular movements. ENT   Head: Normocephalic and atraumatic.   Nose: No congestion/rhinnorhea.   Mouth/Throat: Mucous membranes are moist.   Neck: No  stridor. Hematological/Lymphatic/Immunilogical: No cervical lymphadenopathy. Cardiovascular: Normal rate, regular rhythm. Normal and symmetric distal pulses are present in all extremities. No murmurs, rubs, or gallops. Respiratory: Normal respiratory effort without tachypnea nor retractions. Breath sounds are clear and equal bilaterally. No wheezes/rales/rhonchi. Gastrointestinal: Right upper quadrant tenderness to palpation. No distention. There is no CVA tenderness. Genitourinary: deferred Musculoskeletal: Nontender with normal range of motion in all extremities. No joint effusions.  No lower extremity tenderness nor edema. Neurologic:  Normal speech and language. No gross focal neurologic deficits are appreciated. Speech is normal.  Skin:  Skin is warm, dry and intact. No rash noted. Psychiatric: Mood and affect are normal. Speech and behavior are normal. Patient exhibits appropriate insight and judgment.  ____________________________________________    LABS (pertinent positives/negatives)  Labs Reviewed  COMPREHENSIVE METABOLIC PANEL - Abnormal; Notable for the following:    Potassium 3.4 (*)    Glucose, Bld 103 (*)    Calcium 8.8 (*)    All other components within normal limits  URINALYSIS COMPLETEWITH MICROSCOPIC ([facility] ONLY) - Abnormal; Notable for the following:    Color, Urine YELLOW (*)    APPearance CLEAR (*)    Protein, ur 100 (*)    Bacteria, UA RARE (*)    All other components within normal limits  LIPASE, BLOOD  CBC       RADIOLOGY US Abdomen Limited RUQ (Final result) Result time: 05/28/15 00:36:32   Final result by Rad Results In Interface (05/28/15 00:36:32)   Narrative:   CLINICAL DATA: Acute onset of right upper quadrant abdominal pain. Initial encounter.  EXAM: US ABDOMEN LIMITED - RIGHT UPPER QUADRANT  COMPARISON: None.  FINDINGS: Gallbladder:  A 4 mm stone or polyp is noted within the gallbladder. The gallbladder is otherwise  unremarkable. No gallbladder wall thickening or pericholecystic fluid is seen. No ultrasonographic Murphy's sign is elicited.  Common bile duct:  Diameter: 0.3 cm, within normal limits in caliber.  Liver:  No focal lesion identified. Within normal limits in parenchymal echogenicity.  IMPRESSION: 4 mm stone or polyp within the gallbladder. Gallbladder otherwise unremarkable. No acute abnormality seen at the right upper quadrant.   Electronically Signed By: Garald Balding M.D. On: 05/28/2015 00:36        INITIAL IMPRESSION / ASSESSMENT AND PLAN / ED COURSE  Pertinent labs & imaging results that were available during my care of the patient were reviewed by me and considered in my medical decision making (see chart for details).  History physical exam consistent with gallstones patient referred to Dr. Azalee Course for outpatient evaluation and management.  ____________________________________________   FINAL CLINICAL  IMPRESSION(S) / ED DIAGNOSES  Final diagnoses:  Gallstones      Gregor Hams, MD 05/28/15 0145

## 2015-05-29 ENCOUNTER — Telehealth: Payer: Self-pay | Admitting: General Surgery

## 2015-05-29 NOTE — Telephone Encounter (Signed)
Patient already has an appointment with Dr. Burt Knack but patients girlfriend called and said Dr. Adonis Huguenin said to put him in on the 6th. I just need a time. He is at 100% They are getting married and need this done asap.

## 2015-05-29 NOTE — Telephone Encounter (Signed)
Please call patient and let him know that his appointment has been moved up to 06/06/15 at 1130am in the [facility] with Dr. Adonis Huguenin.

## 2015-05-29 NOTE — Telephone Encounter (Signed)
Call patient to let him know appointment time/day.

## 2015-06-04 ENCOUNTER — Encounter: Payer: Self-pay | Admitting: Emergency Medicine

## 2015-06-04 ENCOUNTER — Observation Stay
Admission: EM | Admit: 2015-06-04 | Discharge: 2015-06-07 | Disposition: A | Discharge status: Home / Self Care | Payer: BLUE CROSS/BLUE SHIELD | Attending: Surgery | Admitting: Surgery

## 2015-06-04 ENCOUNTER — Telehealth: Payer: Self-pay | Admitting: General Surgery

## 2015-06-04 DIAGNOSIS — R160 Hepatomegaly, not elsewhere classified: Secondary | ICD-10-CM | POA: Diagnosis not present

## 2015-06-04 DIAGNOSIS — R110 Nausea: Secondary | ICD-10-CM | POA: Diagnosis not present

## 2015-06-04 DIAGNOSIS — R531 Weakness: Secondary | ICD-10-CM | POA: Diagnosis not present

## 2015-06-04 DIAGNOSIS — Z818 Family history of other mental and behavioral disorders: Secondary | ICD-10-CM | POA: Insufficient documentation

## 2015-06-04 DIAGNOSIS — R1011 Right upper quadrant pain: Secondary | ICD-10-CM | POA: Diagnosis not present

## 2015-06-04 DIAGNOSIS — K811 Chronic cholecystitis: Secondary | ICD-10-CM | POA: Diagnosis not present

## 2015-06-04 DIAGNOSIS — F411 Generalized anxiety disorder: Secondary | ICD-10-CM | POA: Insufficient documentation

## 2015-06-04 DIAGNOSIS — Z8249 Family history of ischemic heart disease and other diseases of the circulatory system: Secondary | ICD-10-CM | POA: Insufficient documentation

## 2015-06-04 DIAGNOSIS — Z806 Family history of leukemia: Secondary | ICD-10-CM | POA: Diagnosis not present

## 2015-06-04 DIAGNOSIS — E7800 Pure hypercholesterolemia, unspecified: Secondary | ICD-10-CM | POA: Insufficient documentation

## 2015-06-04 DIAGNOSIS — Z87891 Personal history of nicotine dependence: Secondary | ICD-10-CM | POA: Insufficient documentation

## 2015-06-04 DIAGNOSIS — K8020 Calculus of gallbladder without cholecystitis without obstruction: Secondary | ICD-10-CM | POA: Diagnosis present

## 2015-06-04 HISTORY — DX: Pure hypercholesterolemia, unspecified: E78.00

## 2015-06-04 LAB — CBC
HCT: 44 % (ref 40.0–52.0)
Hemoglobin: 15.3 g/dL (ref 13.0–18.0)
MCH: 29.4 pg (ref 26.0–34.0)
MCHC: 34.8 g/dL (ref 32.0–36.0)
MCV: 84.4 fL (ref 80.0–100.0)
PLATELETS: 219 10*3/uL (ref 150–440)
RBC: 5.22 MIL/uL (ref 4.40–5.90)
RDW: 13 % (ref 11.5–14.5)
WBC: 7 10*3/uL (ref 3.8–10.6)

## 2015-06-04 LAB — URINALYSIS COMPLETE WITH MICROSCOPIC (ARMC ONLY)
BILIRUBIN URINE: NEGATIVE
Bacteria, UA: NONE SEEN
Glucose, UA: NEGATIVE mg/dL
Hgb urine dipstick: NEGATIVE
Ketones, ur: NEGATIVE mg/dL
Leukocytes, UA: NEGATIVE
Nitrite: NEGATIVE
Protein, ur: NEGATIVE mg/dL
Specific Gravity, Urine: 1.016 (ref 1.005–1.030)
pH: 5 (ref 5.0–8.0)

## 2015-06-04 LAB — LIPASE, BLOOD: LIPASE: 35 U/L (ref 11–51)

## 2015-06-04 LAB — COMPREHENSIVE METABOLIC PANEL
ALBUMIN: 4.6 g/dL (ref 3.5–5.0)
ALT: 21 U/L (ref 17–63)
AST: 20 U/L (ref 15–41)
Alkaline Phosphatase: 53 U/L (ref 38–126)
Anion gap: 8 (ref 5–15)
BUN: 13 mg/dL (ref 6–20)
CHLORIDE: 101 mmol/L (ref 101–111)
CO2: 26 mmol/L (ref 22–32)
CREATININE: 1.02 mg/dL (ref 0.61–1.24)
Calcium: 9.2 mg/dL (ref 8.9–10.3)
GFR calc non Af Amer: >60 mL/min (ref >60)
GLUCOSE: 88 mg/dL (ref 65–99)
Potassium: 4.3 mmol/L (ref 3.5–5.1)
SODIUM: 135 mmol/L (ref 135–145)
Total Bilirubin: 0.9 mg/dL (ref 0.3–1.2)
Total Protein: 7.1 g/dL (ref 6.5–8.1)

## 2015-06-04 MED ORDER — FENTANYL CITRATE (PF) 100 MCG/2ML IJ SOLN
50.0000 ug | INTRAMUSCULAR | Status: DC | PRN
  Administered 2015-06-04: 50 ug via INTRAVENOUS
  Filled 2015-06-04: qty 2

## 2015-06-04 MED ORDER — ONDANSETRON 4 MG PO TBDP
ORAL_TABLET | ORAL | Status: AC
  Filled 2015-06-04: qty 1

## 2015-06-04 MED ORDER — ONDANSETRON 4 MG PO TBDP
4.0000 mg | ORAL_TABLET | Freq: Once | ORAL | Status: AC | PRN
  Administered 2015-06-04: 4 mg via ORAL

## 2015-06-04 NOTE — ED Notes (Signed)
Was seen last week for abd pain and gallstones.. States pain is worse

## 2015-06-04 NOTE — Telephone Encounter (Signed)
Returned phone call to patient's girlfriend at this time. No answer. Left voicemail for return phone call.

## 2015-06-04 NOTE — ED Provider Notes (Signed)
[facility] Emergency Department Provider Note  ____________________________________________  Time seen: 11:30 PM  I have reviewed the triage vital signs and the nursing notes.   HISTORY  Chief Complaint Abdominal Pain      HPI Gilbert Cook is a 30 y.o. male recently diagnosed on March 28 with gallstones presents with 7 out of 10  nonradiating RUQ pain and nausea. Patient denies fever patient denies any aggravating or alleviating factors for his pain.     History reviewed. No pertinent past medical history.  Patient Active Problem List   Diagnosis Date Noted  . Quadriceps weakness 10/09/2013  . Muscle cramps 09/16/2013  . Generalized anxiety disorder 03/18/2013  . Iliotibial band syndrome of right side 02/11/2013  . Encounter for preventive health examination 12/17/2012  . Hepatomegaly 12/17/2012    Past Surgical History  Procedure Laterality Date  . Fracture surgery Right 2013    wrist    Current Outpatient Rx  Name  Route  Sig  Dispense  Refill  . ALPRAZolam (XANAX) 0.5 MG tablet   Oral   Take 1 tablet (0.5 mg total) by mouth at bedtime as needed for anxiety.   30 tablet   0   . busPIRone (BUSPAR) 7.5 MG tablet   Oral   Take 1 tablet (7.5 mg total) by mouth 3 (three) times daily.   90 tablet   1   . cyclobenzaprine (FLEXERIL) 10 MG tablet   Oral   Take 1 tablet (10 mg total) by mouth 3 (three) times daily as needed for muscle spasms.   30 tablet   2   . diclofenac (VOLTAREN) 75 MG EC tablet   Oral   Take 1 tablet (75 mg total) by mouth 2 (two) times daily. For muscle cramps/pain   60 tablet   3   . meloxicam (MOBIC) 15 MG tablet   Oral   Take 1 tablet (15 mg total) by mouth daily.   30 tablet   2   . Multiple Vitamin (MULTIVITAMIN) tablet   Oral   Take 1 tablet by mouth daily.         . ondansetron (ZOFRAN ODT) 4 MG disintegrating tablet   Oral   Take 1 tablet (4 mg total) by mouth every 8 (eight) hours as  needed for nausea or vomiting.   20 tablet   0   . oxyCODONE-acetaminophen (ROXICET) 5-325 MG tablet   Oral   Take 1 tablet by mouth every 4 (four) hours as needed for severe pain.   20 tablet   0     Allergies No Known Drug allergies  Family History  Problem Relation Age of Onset  . Hypertension Mother   . Hypertension Father   . Cancer Father 68    T cell leukemia  . Depression Maternal Uncle   . Schizophrenia Maternal Uncle   . Hypertension Maternal Grandmother   . Hypertension Maternal Grandfather   . Hypertension Paternal Grandmother   . Hypertension Paternal Grandfather     Social History Social History  Substance Use Topics  . Smoking status: Former Smoker -- 0.50 packs/day for 5 years    Types: Cigarettes    Quit date: 12/15/2010  . Smokeless tobacco: Never Used  . Alcohol Use: Yes    Review of Systems  Constitutional: Negative for fever. Eyes: Negative for visual changes. ENT: Negative for sore throat. Cardiovascular: Negative for chest pain. Respiratory: Negative for shortness of breath. Gastrointestinal: Positive for abdominal pain and nausea  Genitourinary: Negative for dysuria. Musculoskeletal: Negative for back pain. Skin: Negative for rash. Neurological: Negative for headaches, focal weakness or numbness.   10-point ROS otherwise negative.  ____________________________________________   PHYSICAL EXAM:  VITAL SIGNS: ED Triage Vitals  Enc Vitals Group     BP 06/04/15 1733 128/65 mmHg     Pulse Rate 06/04/15 1733 60     Resp 06/04/15 1733 20     Temp 06/04/15 1733 98 F (36.7 C)     Temp Source 06/04/15 1733 Oral     SpO2 06/04/15 1733 97 %     Weight 06/04/15 1733 180 lb (81.647 kg)     Height 06/04/15 1733 6' (1.829 m)     Head Cir --      Peak Flow --      Pain Score 06/04/15 1733 7     Pain Loc --      Pain Edu? --      Excl. in [place]? --      Constitutional: Alert and oriented. Well appearing and in no distress. Eyes:  Conjunctivae are normal. PERRL. Normal extraocular movements. ENT   Head: Normocephalic and atraumatic.   Nose: No congestion/rhinnorhea.   Mouth/Throat: Mucous membranes are moist.   Neck: No stridor. Hematological/Lymphatic/Immunilogical: No cervical lymphadenopathy. Cardiovascular: Normal rate, regular rhythm. Normal and symmetric distal pulses are present in all extremities. No murmurs, rubs, or gallops. Respiratory: Normal respiratory effort without tachypnea nor retractions. Breath sounds are clear and equal bilaterally. No wheezes/rales/rhonchi. Gastrointestinal: Right upper quadrant tenderness to palpation No distention. There is no CVA tenderness. Genitourinary: deferred Musculoskeletal: Nontender with normal range of motion in all extremities. No joint effusions.  No lower extremity tenderness nor edema. Neurologic:  Normal speech and language. No gross focal neurologic deficits are appreciated. Speech is normal.  Skin:  Skin is warm, dry and intact. No rash noted. Psychiatric: Mood and affect are normal. Speech and behavior are normal. Patient exhibits appropriate insight and judgment.  ____________________________________________    LABS (pertinent positives/negatives)  Labs Reviewed  SURGICAL PCR SCREEN - Abnormal; Notable for the following:    MRSA, PCR POSITIVE (*)    Staphylococcus aureus POSITIVE (*)    All other components within normal limits  URINALYSIS COMPLETEWITH MICROSCOPIC ([facility] ONLY) - Abnormal; Notable for the following:    Color, Urine YELLOW (*)    APPearance CLEAR (*)    Squamous Epithelial / LPF 0-5 (*)    All other components within normal limits  LIPASE, BLOOD  COMPREHENSIVE METABOLIC PANEL  CBC      INITIAL IMPRESSION / ASSESSMENT AND PLAN / ED COURSE  Pertinent labs & imaging results that were available during my care of the patient were reviewed by me and considered in my medical decision making (see chart for  details).  Patient discussed with Dr. Burt Knack general surgeon on call for hospital admission for further management  ____________________________________________   FINAL CLINICAL IMPRESSION(S) / ED DIAGNOSES  Final diagnoses:  None  Cholelithiasis    Gregor Hams, MD 06/06/15 0730

## 2015-06-04 NOTE — Telephone Encounter (Signed)
Patients girlfriend, Ortencia Kick, called and said patient has an appointment Thursday with Dr. Adonis Huguenin. He is hurting and would like to know if he can come in today?

## 2015-06-04 NOTE — ED Notes (Signed)
Iv pain meds given / pt placed in subwait.

## 2015-06-04 NOTE — Telephone Encounter (Signed)
8/10 pain- constant pain in RUQ and is nonradiating, denies fever, nausea but denies vomiting- using Zofran and this is helping. Patient has been controlling pain at home but reports that pain is getting out of control.  Spoke with Dr. Azalee Course at this time. She would like patient to be seen in the Emergency Room.  Call made to Dr. Dahlia Byes (surgeon on call) at this time.

## 2015-06-05 DIAGNOSIS — K8020 Calculus of gallbladder without cholecystitis without obstruction: Secondary | ICD-10-CM | POA: Diagnosis not present

## 2015-06-05 LAB — SURGICAL PCR SCREEN
MRSA, PCR: POSITIVE — AB
Staphylococcus aureus: POSITIVE — AB

## 2015-06-05 MED ORDER — CEFAZOLIN SODIUM 1-5 GM-% IV SOLN
INTRAVENOUS | Status: AC
Start: 2015-06-05 — End: 2015-06-05
  Administered 2015-06-05: 1 g via INTRAVENOUS
  Filled 2015-06-05: qty 50

## 2015-06-05 MED ORDER — DEXTROSE IN LACTATED RINGERS 5 % IV SOLN
INTRAVENOUS | Status: DC
  Administered 2015-06-05 – 2015-06-06 (×3): via INTRAVENOUS
  Filled 2015-06-05 (×3): qty 1000

## 2015-06-05 MED ORDER — GABAPENTIN 400 MG PO CAPS
400.0000 mg | ORAL_CAPSULE | Freq: Three times a day (TID) | ORAL | Status: DC
  Administered 2015-06-05 – 2015-06-06 (×3): 400 mg via ORAL
  Filled 2015-06-05 (×2): qty 1

## 2015-06-05 MED ORDER — CEFAZOLIN SODIUM 1-5 GM-% IV SOLN
1.0000 g | Freq: Four times a day (QID) | INTRAVENOUS | Status: DC
  Administered 2015-06-05 – 2015-06-06 (×6): 1 g via INTRAVENOUS
  Filled 2015-06-05 (×11): qty 50

## 2015-06-05 MED ORDER — GABAPENTIN 800 MG PO TABS
400.0000 mg | ORAL_TABLET | Freq: Three times a day (TID) | ORAL | Status: DC
  Administered 2015-06-05: 400 mg via ORAL
  Filled 2015-06-05 (×2): qty 0.5

## 2015-06-05 MED ORDER — ONDANSETRON HCL 4 MG PO TABS: 4.0000 mg | ORAL_TABLET | Freq: Four times a day (QID) | ORAL | Status: DC | PRN

## 2015-06-05 MED ORDER — HYDROMORPHONE HCL 1 MG/ML IJ SOLN
1.0000 mg | INTRAMUSCULAR | Status: DC | PRN
  Administered 2015-06-05 – 2015-06-06 (×8): 1 mg via INTRAVENOUS
  Filled 2015-06-05 (×8): qty 1

## 2015-06-05 MED ORDER — ONDANSETRON HCL 4 MG/2ML IJ SOLN
4.0000 mg | Freq: Four times a day (QID) | INTRAMUSCULAR | Status: DC | PRN
  Administered 2015-06-05 – 2015-06-06 (×3): 4 mg via INTRAVENOUS
  Filled 2015-06-05 (×5): qty 2

## 2015-06-05 NOTE — H&P (Signed)
Gilbert Cook is an 30 y.o. male.    Chief Complaint: At upper quadrant pain  HPI: This a patient with recurrent and episodic right upper quadrant pain. He is been to the emergency room once and hasn't made multiple calls to her office attempting to schedule an urgent appointment for unrelenting abdominal pain. This is started back 3 years ago and was worsened over the last 8 days he's had nausea but no emesis no fevers chills no jaundice or acholic stools. He denies any abdominal surgery and denies taking any medications but there is a medication list in the chart and is quite extensive. 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 No pertinent past medical history.  Past Surgical History  Procedure Laterality Date  . Fracture surgery Right 2013    wrist    Family History  Problem Relation Age of Onset  . Hypertension Mother   . Hypertension Father   . Cancer Father 68    T cell leukemia  . Depression Maternal Uncle   . Schizophrenia Maternal Uncle   . Hypertension Maternal Grandmother   . Hypertension Maternal Grandfather   . Hypertension Paternal Grandmother   . Hypertension Paternal Grandfather    Social History:  reports that he quit smoking about 4 years ago. His smoking use included Cigarettes. He has a 2.5 pack-year smoking history. He has never used smokeless tobacco. He reports that he drinks alcohol. He reports that he uses illicit drugs (Marijuana).  Allergies: No Known Allergies   (Not in a hospital admission)   Review of Systems  Constitutional: Negative for fever and chills.  HENT: Negative.   Eyes: Negative.   Respiratory: Negative.   Cardiovascular: Negative.   Gastrointestinal: Positive for nausea and abdominal pain. Negative for heartburn, vomiting, diarrhea, constipation, blood in stool and melena.  Genitourinary: Negative.   Musculoskeletal: Negative.   Skin: Negative.   Neurological: Negative.    Endo/Heme/Allergies: Negative.   Psychiatric/Behavioral: Negative.      Physical Exam:  BP 121/70 mmHg  Pulse 60  Temp(Src) 98 F (36.7 C) (Oral)  Resp 18  Ht 6' (1.829 m)  Wt 180 lb (81.647 kg)  BMI 24.41 kg/m2  SpO2 100%  Physical Exam  Constitutional: He is oriented to person, place, and time and well-developed, well-nourished, and in no distress. No distress.  HENT:  Head: Normocephalic and atraumatic.  Eyes: Pupils are equal, round, and reactive to light. Right eye exhibits no discharge. Left eye exhibits no discharge. No scleral icterus.  Neck: Normal range of motion.  Cardiovascular: Normal rate, regular rhythm and normal heart sounds.   Pulmonary/Chest: Effort normal and breath sounds normal. No respiratory distress. He has no wheezes. He has no rales.  Abdominal: Soft. He exhibits no distension. There is tenderness. There is no rebound and no guarding.  Tender in the right upper quadrant with a questionable Murphy sign  Musculoskeletal: Normal range of motion. He exhibits no edema.  Lymphadenopathy:    He has no cervical adenopathy.  Neurological: He is alert and oriented to person, place, and time.  Skin: Skin is warm. No rash noted. He is not diaphoretic. No erythema.  Psychiatric: Mood and affect normal.        Results for orders placed or performed during the hospital encounter of 06/04/15 (from the past 48 hour(s))  Lipase, blood     Status: None   Collection Time: 06/04/15  5:43 PM  Result Value Ref  Range   Lipase 35 11 - 51 U/L  Comprehensive metabolic panel     Status: None   Collection Time: 06/04/15  5:43 PM  Result Value Ref Range   Sodium 135 135 - 145 mmol/L   Potassium 4.3 3.5 - 5.1 mmol/L   Chloride 101 101 - 111 mmol/L   CO2 26 22 - 32 mmol/L   Glucose, Bld 88 65 - 99 mg/dL   BUN 13 6 - 20 mg/dL   Creatinine, Ser 1.02 0.61 - 1.24 mg/dL   Calcium 9.2 8.9 - 10.3 mg/dL   Total Protein 7.1 6.5 - 8.1 g/dL   Albumin 4.6 3.5 - 5.0 g/dL    AST 20 15 - 41 U/L   ALT 21 17 - 63 U/L   Alkaline Phosphatase 53 38 - 126 U/L   Total Bilirubin 0.9 0.3 - 1.2 mg/dL   GFR calc non Af Amer >60 >60 mL/min   GFR calc Af Amer >60 >60 mL/min    Comment: (NOTE) The eGFR has been calculated using the CKD EPI equation. This calculation has not been validated in all clinical situations. eGFR's persistently <60 mL/min signify possible Chronic Kidney Disease.    Anion gap 8 5 - 15  CBC     Status: None   Collection Time: 06/04/15  5:43 PM  Result Value Ref Range   WBC 7.0 3.8 - 10.6 K/uL   RBC 5.22 4.40 - 5.90 MIL/uL   Hemoglobin 15.3 13.0 - 18.0 g/dL   HCT 44.0 40.0 - 52.0 %   MCV 84.4 80.0 - 100.0 fL   MCH 29.4 26.0 - 34.0 pg   MCHC 34.8 32.0 - 36.0 g/dL   RDW 13.0 11.5 - 14.5 %   Platelets 219 150 - 440 K/uL  Urinalysis complete, with microscopic ([facility] only)     Status: Abnormal   Collection Time: 06/04/15 10:52 PM  Result Value Ref Range   Color, Urine YELLOW (A) YELLOW   APPearance CLEAR (A) CLEAR   Glucose, UA NEGATIVE NEGATIVE mg/dL   Bilirubin Urine NEGATIVE NEGATIVE   Ketones, ur NEGATIVE NEGATIVE mg/dL   Specific Gravity, Urine 1.016 1.005 - 1.030   Hgb urine dipstick NEGATIVE NEGATIVE   pH 5.0 5.0 - 8.0   Protein, ur NEGATIVE NEGATIVE mg/dL   Nitrite NEGATIVE NEGATIVE   Leukocytes, UA NEGATIVE NEGATIVE   RBC / HPF 0-5 0 - 5 RBC/hpf   WBC, UA 0-5 0 - 5 WBC/hpf   Bacteria, UA NONE SEEN NONE SEEN   Squamous Epithelial / LPF 0-5 (A) NONE SEEN   Mucous PRESENT    No results found.   Assessment/Plan  Very symptomatic cholelithiasis with inability to control pain recommended admission to the hospital and starting IV antibiotics and plan laparoscopic cholecystectomy tomorrow correction later today. The rationale for this was discussed the options of observation reviewed risks of bleeding infection recurrence of symptoms failure to resolve all of his symptoms (procedure bile duct damage bile duct leak retained stone or  bowel injury were all reviewed with him the fact that Dr. Dahlia Byes would be performing his surgery was discussed as well he had his family understood and agreed to proceed  Florene Glen, MD, FACS

## 2015-06-05 NOTE — Progress Notes (Signed)
   06/05/15 1000  Clinical Encounter Type  Visited With Patient and family together  Visit Type Initial  Consult/Referral To Chaplain  Chaplain visited with patient and family and offered pastoral services. Patient was trying to sleep and visit was inturrupted by physician's visit.   [place] [phone]

## 2015-06-05 NOTE — Progress Notes (Signed)
Intermatic severe biliary colic needing hospital admission for pain management Still had intermittent abdominal pain that is suspicious for cholecystitis. No n/v,  AVSS  PE  NAD , awake alert Abd: soft, mild TTP , no murphy no peritonitis Ext: well perfused  A/P recent right biliary colic versus early acute cholecystitis. Schedule patient detail about his situation unfortunately or schedule is very full today and we will go ahead and schedule him for tomorrow for laparoscopic cholecystectomy. Discussed with the patient detail about the operation, risk, benefits and possible complications including but not limited to: Bleeding, infection, re-interventions, common bile duct injury, need for ERCP. He understands and wishes to proceed. Extensive counseling provided

## 2015-06-06 ENCOUNTER — Observation Stay: Payer: BLUE CROSS/BLUE SHIELD | Admitting: Certified Registered Nurse Anesthetist

## 2015-06-06 ENCOUNTER — Encounter: Admission: EM | Disposition: A | Discharge status: Home / Self Care | Payer: Self-pay | Source: Home / Self Care

## 2015-06-06 ENCOUNTER — Encounter: Payer: Self-pay | Admitting: *Deleted

## 2015-06-06 ENCOUNTER — Ambulatory Visit: Payer: Self-pay | Admitting: General Surgery

## 2015-06-06 DIAGNOSIS — K8020 Calculus of gallbladder without cholecystitis without obstruction: Secondary | ICD-10-CM | POA: Diagnosis not present

## 2015-06-06 HISTORY — PX: CHOLECYSTECTOMY: SHX55

## 2015-06-06 SURGERY — LAPAROSCOPIC CHOLECYSTECTOMY WITH INTRAOPERATIVE CHOLANGIOGRAM
Anesthesia: General | Wound class: Clean Contaminated

## 2015-06-06 MED ORDER — KETOROLAC TROMETHAMINE 30 MG/ML IJ SOLN
INTRAMUSCULAR | Status: DC | PRN
  Administered 2015-06-06: 30 mg via INTRAVENOUS

## 2015-06-06 MED ORDER — OXYCODONE-ACETAMINOPHEN 7.5-325 MG PO TABS
1.0000 | ORAL_TABLET | ORAL | Status: DC | PRN
  Administered 2015-06-06 – 2015-06-07 (×3): 1 via ORAL
  Filled 2015-06-06 (×3): qty 1

## 2015-06-06 MED ORDER — FENTANYL CITRATE (PF) 100 MCG/2ML IJ SOLN
INTRAMUSCULAR | Status: AC
  Administered 2015-06-06: 50 ug via INTRAVENOUS
  Filled 2015-06-06: qty 2

## 2015-06-06 MED ORDER — FAMOTIDINE 20 MG PO TABS
20.0000 mg | ORAL_TABLET | Freq: Once | ORAL | Status: AC
  Administered 2015-06-06: 20 mg via ORAL

## 2015-06-06 MED ORDER — ACETAMINOPHEN 10 MG/ML IV SOLN
INTRAVENOUS | Status: AC
  Filled 2015-06-06: qty 100

## 2015-06-06 MED ORDER — DOCUSATE SODIUM 100 MG PO CAPS
100.0000 mg | ORAL_CAPSULE | Freq: Two times a day (BID) | ORAL | Status: DC
  Administered 2015-06-06: 100 mg via ORAL
  Filled 2015-06-06: qty 1

## 2015-06-06 MED ORDER — FENTANYL CITRATE (PF) 100 MCG/2ML IJ SOLN
INTRAMUSCULAR | Status: AC
  Filled 2015-06-06: qty 2

## 2015-06-06 MED ORDER — MUPIROCIN 2 % EX OINT
1.0000 "application " | TOPICAL_OINTMENT | Freq: Two times a day (BID) | CUTANEOUS | Status: DC
  Administered 2015-06-06 – 2015-06-07 (×3): 1 via NASAL
  Filled 2015-06-06 (×2): qty 22

## 2015-06-06 MED ORDER — FENTANYL CITRATE (PF) 100 MCG/2ML IJ SOLN
25.0000 ug | INTRAMUSCULAR | Status: DC | PRN
  Administered 2015-06-06 (×3): 50 ug via INTRAVENOUS

## 2015-06-06 MED ORDER — ACETAMINOPHEN 10 MG/ML IV SOLN
INTRAVENOUS | Status: DC | PRN
  Administered 2015-06-06: 1000 mg via INTRAVENOUS

## 2015-06-06 MED ORDER — SUGAMMADEX SODIUM 200 MG/2ML IV SOLN
INTRAVENOUS | Status: DC | PRN
  Administered 2015-06-06: 165 mg via INTRAVENOUS

## 2015-06-06 MED ORDER — CHLORHEXIDINE GLUCONATE CLOTH 2 % EX PADS
6.0000 | MEDICATED_PAD | Freq: Every day | CUTANEOUS | Status: DC
  Administered 2015-06-06: 6 via TOPICAL

## 2015-06-06 MED ORDER — BUPIVACAINE-EPINEPHRINE (PF) 0.25% -1:200000 IJ SOLN
INTRAMUSCULAR | Status: AC
  Filled 2015-06-06: qty 30

## 2015-06-06 MED ORDER — FENTANYL CITRATE (PF) 100 MCG/2ML IJ SOLN
INTRAMUSCULAR | Status: DC | PRN
  Administered 2015-06-06 (×2): 50 ug via INTRAVENOUS

## 2015-06-06 MED ORDER — LACTATED RINGERS IV SOLN
INTRAVENOUS | Status: DC
  Administered 2015-06-06: 13:00:00 via INTRAVENOUS

## 2015-06-06 MED ORDER — BUPIVACAINE-EPINEPHRINE 0.25% -1:200000 IJ SOLN
INTRAMUSCULAR | Status: DC | PRN
  Administered 2015-06-06: 30 mL

## 2015-06-06 MED ORDER — ROCURONIUM BROMIDE 100 MG/10ML IV SOLN
INTRAVENOUS | Status: DC | PRN
  Administered 2015-06-06: 5 mg via INTRAVENOUS
  Administered 2015-06-06: 25 mg via INTRAVENOUS

## 2015-06-06 MED ORDER — LIDOCAINE HCL (CARDIAC) 20 MG/ML IV SOLN
INTRAVENOUS | Status: DC | PRN
  Administered 2015-06-06: 60 mg via INTRAVENOUS

## 2015-06-06 MED ORDER — SUCCINYLCHOLINE CHLORIDE 20 MG/ML IJ SOLN
INTRAMUSCULAR | Status: DC | PRN
  Administered 2015-06-06: 140 mg via INTRAVENOUS

## 2015-06-06 MED ORDER — DEXAMETHASONE SODIUM PHOSPHATE 10 MG/ML IJ SOLN
INTRAMUSCULAR | Status: DC | PRN
  Administered 2015-06-06: 10 mg via INTRAVENOUS

## 2015-06-06 MED ORDER — PROPOFOL 10 MG/ML IV BOLUS
INTRAVENOUS | Status: DC | PRN
  Administered 2015-06-06: 200 mg via INTRAVENOUS

## 2015-06-06 MED ORDER — MIDAZOLAM HCL 2 MG/2ML IJ SOLN
INTRAMUSCULAR | Status: DC | PRN
  Administered 2015-06-06: 2 mg via INTRAVENOUS

## 2015-06-06 MED ORDER — FAMOTIDINE 20 MG PO TABS
ORAL_TABLET | ORAL | Status: AC
  Filled 2015-06-06: qty 1

## 2015-06-06 MED ORDER — ONDANSETRON HCL 4 MG/2ML IJ SOLN: 4.0000 mg | Freq: Once | INTRAMUSCULAR | Status: DC | PRN

## 2015-06-06 MED ORDER — DIPHENHYDRAMINE HCL 25 MG PO CAPS
50.0000 mg | ORAL_CAPSULE | Freq: Once | ORAL | Status: AC
  Administered 2015-06-06: 50 mg via ORAL
  Filled 2015-06-06: qty 2

## 2015-06-06 MED ORDER — CEFAZOLIN SODIUM 1-5 GM-% IV SOLN
INTRAVENOUS | Status: AC
  Administered 2015-06-06: 1000 mg
  Filled 2015-06-06: qty 50

## 2015-06-06 MED ORDER — ONDANSETRON HCL 4 MG/2ML IJ SOLN
INTRAMUSCULAR | Status: DC | PRN
  Administered 2015-06-06: 4 mg via INTRAVENOUS

## 2015-06-06 MED ORDER — KETOROLAC TROMETHAMINE 30 MG/ML IJ SOLN
30.0000 mg | Freq: Four times a day (QID) | INTRAMUSCULAR | Status: DC
  Administered 2015-06-06 – 2015-06-07 (×3): 30 mg via INTRAVENOUS
  Filled 2015-06-06 (×3): qty 1

## 2015-06-06 SURGICAL SUPPLY — 38 items
APPLIER CLIP 5 13 M/L LIGAMAX5 (MISCELLANEOUS) ×3
CANISTER SUCT 1200ML W/VALVE (MISCELLANEOUS) ×3 IMPLANT
CHLORAPREP W/TINT 26ML (MISCELLANEOUS) ×3 IMPLANT
CHOLANGIOGRAM CATH TAUT (CATHETERS) IMPLANT
CLEANER CAUTERY TIP 5X5 PAD (MISCELLANEOUS) ×1 IMPLANT
CLIP APPLIE 5 13 M/L LIGAMAX5 (MISCELLANEOUS) ×1 IMPLANT
DECANTER SPIKE VIAL GLASS SM (MISCELLANEOUS) IMPLANT
DEVICE TROCAR PUNCTURE CLOSURE (ENDOMECHANICALS) IMPLANT
DRAPE C-ARM XRAY 36X54 (DRAPES) IMPLANT
ELECT REM PT RETURN 9FT ADLT (ELECTROSURGICAL) ×3
ELECTRODE REM PT RTRN 9FT ADLT (ELECTROSURGICAL) ×1 IMPLANT
ENDOPOUCH RETRIEVER 10 (MISCELLANEOUS) ×3 IMPLANT
GLOVE BIO SURGEON STRL SZ7 (GLOVE) ×18 IMPLANT
GOWN STRL REUS W/ TWL LRG LVL3 (GOWN DISPOSABLE) ×3 IMPLANT
GOWN STRL REUS W/TWL LRG LVL3 (GOWN DISPOSABLE) ×6
IRRIGATION STRYKERFLOW (MISCELLANEOUS) ×1 IMPLANT
IRRIGATOR STRYKERFLOW (MISCELLANEOUS) ×3
IV SOD CHL 0.9% 1000ML (IV SOLUTION) ×3 IMPLANT
L-HOOK LAP DISP 36CM (ELECTROSURGICAL) ×3
LABEL OR SOLS (LABEL) ×3 IMPLANT
LHOOK LAP DISP 36CM (ELECTROSURGICAL) ×1 IMPLANT
LIQUID BAND (GAUZE/BANDAGES/DRESSINGS) ×3 IMPLANT
NEEDLE HYPO 22GX1.5 SAFETY (NEEDLE) ×3 IMPLANT
PACK LAP CHOLECYSTECTOMY (MISCELLANEOUS) ×3 IMPLANT
PAD CLEANER CAUTERY TIP 5X5 (MISCELLANEOUS) ×2
PENCIL ELECTRO HAND CTR (MISCELLANEOUS) ×3 IMPLANT
SCISSORS METZENBAUM CVD 33 (INSTRUMENTS) ×3 IMPLANT
SLEEVE ADV FIXATION 5X100MM (TROCAR) ×6 IMPLANT
STOPCOCK 3 WAY  REPLAC (MISCELLANEOUS) IMPLANT
SUT ETHIBOND 0 MO6 C/R (SUTURE) IMPLANT
SUT MNCRL AB 4-0 PS2 18 (SUTURE) ×3 IMPLANT
SUT VIC AB 0 CT1 36 (SUTURE) IMPLANT
SUT VICRYL 0 AB UR-6 (SUTURE) ×9 IMPLANT
SYR 20CC LL (SYRINGE) ×3 IMPLANT
TROCAR XCEL BLUNT TIP 100MML (ENDOMECHANICALS) ×3 IMPLANT
TROCAR Z-THREAD OPTICAL 5X100M (TROCAR) ×3 IMPLANT
TUBING INSUFFLATOR HI FLOW (MISCELLANEOUS) ×3 IMPLANT
WATER STERILE IRR 1000ML POUR (IV SOLUTION) IMPLANT

## 2015-06-06 NOTE — Progress Notes (Signed)
Chaplain rounded the unit and provided a compassionate presence and spiritual to the patient prior to surgery. Gilbert Cook [phone]

## 2015-06-06 NOTE — Anesthesia Procedure Notes (Signed)
Procedure Name: Intubation Date/Time: 06/06/2015 1:40 PM Performed by: Johnna Acosta Pre-anesthesia Checklist: Patient identified, Emergency Drugs available, Suction available, Patient being monitored and Timeout performed Patient Re-evaluated:Patient Re-evaluated prior to inductionOxygen Delivery Method: Circle system utilized Preoxygenation: Pre-oxygenation with 100% oxygen Intubation Type: IV induction Ventilation: Mask ventilation without difficulty Laryngoscope Size: Miller and 2 Grade View: Grade I Tube type: Oral Tube size: 7.5 mm Number of attempts: 1 Airway Equipment and Method: Stylet Placement Confirmation: ETT inserted through vocal cords under direct vision,  positive ETCO2 and breath sounds checked- equal and bilateral Secured at: 21 cm Tube secured with: Tape Dental Injury: Teeth and Oropharynx as per pre-operative assessment

## 2015-06-06 NOTE — Anesthesia Preprocedure Evaluation (Signed)
Anesthesia Evaluation  Patient identified by MRN, date of birth, ID band Patient awake    Reviewed: Allergy & Precautions, H&P , NPO status , Patient's Chart, lab work & pertinent test results, reviewed documented beta blocker date and time   History of Anesthesia Complications Negative for: history of anesthetic complications  Airway Mallampati: II  TM Distance: >3 FB Neck ROM: full    Dental no notable dental hx. (+) Teeth Intact   Pulmonary neg pulmonary ROS, former smoker,    Pulmonary exam normal breath sounds clear to auscultation       Cardiovascular Exercise Tolerance: Good negative cardio ROS Normal cardiovascular exam Rhythm:regular Rate:Normal     Neuro/Psych PSYCHIATRIC DISORDERS (Anxiety) negative neurological ROS     GI/Hepatic negative GI ROS, Neg liver ROS,   Endo/Other  negative endocrine ROS  Renal/GU negative Renal ROS  negative genitourinary   Musculoskeletal   Abdominal   Peds  Hematology negative hematology ROS (+)   Anesthesia Other Findings Past Medical History:   Hypercholesteremia                                           Reproductive/Obstetrics negative OB ROS                             Anesthesia Physical Anesthesia Plan  ASA: I  Anesthesia Plan: General   Post-op Pain Management:    Induction:   Airway Management Planned:   Additional Equipment:   Intra-op Plan:   Post-operative Plan:   Informed Consent: I have reviewed the patients History and Physical, chart, labs and discussed the procedure including the risks, benefits and alternatives for the proposed anesthesia with the patient or authorized representative who has indicated his/her understanding and acceptance.   Dental Advisory Given  Plan Discussed with: Anesthesiologist, CRNA and Surgeon  Anesthesia Plan Comments:         Anesthesia Quick Evaluation

## 2015-06-06 NOTE — Progress Notes (Signed)
Completed inventory count in pixus with J.Simser,RN for prn percocet to correct discrepancy created by J.Simser, RN.  Drawer was recovered successfully, pharmacy notified. pixus showing correct current count of 26.

## 2015-06-06 NOTE — Op Note (Signed)
Laparoscopic Cholecystectomy  Pre-operative Diagnosis: Symptomatic GB polyp vs Gallstone  Post-operative Diagnosis: Same  Procedure: lap chole  Surgeon: Caroleen Hamman, MD FACS  Anesthesia: Gen. with endotracheal tube   Findings:GB  Estimated Blood Loss: 5cc         Drains: none         Specimens: Gallbladder           Complications: none   Procedure Details  The patient was seen again in the Holding Room. The benefits, complications, treatment options, and expected outcomes were discussed with the patient. The risks of bleeding, infection, recurrence of symptoms, failure to resolve symptoms, bile duct damage, bile duct leak, retained common bile duct stone, bowel injury, any of which could require further surgery and/or ERCP, stent, or papillotomy were reviewed with the patient. The likelihood of improving the patient's symptoms with return to their baseline status is good.  The patient and/or family concurred with the proposed plan, giving informed consent.  The patient was taken to Operating Room, identified as Gilbert Cook and the procedure verified as Laparoscopic Cholecystectomy.  A Time Out was held and the above information confirmed.  Prior to the induction of general anesthesia, antibiotic prophylaxis was administered. VTE prophylaxis was in place. General endotracheal anesthesia was then administered and tolerated well. After the induction, the abdomen was prepped with Chloraprep and draped in the sterile fashion. The patient was positioned in the supine position.  Local anesthetic  was injected into the skin near the umbilicus and an incision made. Cut down technique was used to enter the abdominal cavity and a Hasson trochar was placed after two vicryl stitches were anchored to the fascia. Pneumoperitoneum was then created with CO2 and tolerated well without any adverse changes in the patient's vital signs.  Three 5-mm ports were placed in the right upper quadrant all  under direct vision. All skin incisions  were infiltrated with a local anesthetic agent before making the incision and placing the trocars.   The patient was positioned  in reverse Trendelenburg, tilted slightly to the patient's left.  The gallbladder was identified, the fundus grasped and retracted cephalad. Adhesions were lysed bluntly. The infundibulum was grasped and retracted laterally, exposing the peritoneum overlying the triangle of Calot. This was then divided and exposed in a blunt fashion. An extended critical view of the cystic duct and cystic artery was obtained.  The cystic duct was clearly identified and bluntly dissected.   Artery and duct were double clipped and divided.  The gallbladder was taken from the gallbladder fossa in a retrograde fashion with the electrocautery. The gallbladder was removed and placed in an Endocatch bag. The liver bed was irrigated and inspected. Hemostasis was achieved with the electrocautery. Copious irrigation was utilized and was repeatedly aspirated until clear.  The gallbladder and Endocatch sac were then removed through the epigastric port site.    Inspection of the right upper quadrant was performed. No bleeding, bile duct injury or leak, or bowel injury was noted. Pneumoperitoneum was released.  The periumbilical port site was closed with figure-of-eight 0 Vicryl sutures. 4-0 subcuticular Monocryl was used to close the skin. Dermabond was  applied.  The patient was then extubated and brought to the recovery room in stable condition. Sponge, lap, and needle counts were correct at closure and at the conclusion of the case.               Caroleen Hamman, MD, FACS

## 2015-06-06 NOTE — Transfer of Care (Signed)
Immediate Anesthesia Transfer of Care Note  Patient: Gilbert Cook  Procedure(s) Performed: Procedure(s): LAPAROSCOPIC CHOLECYSTECTOMY WITH INTRAOPERATIVE CHOLANGIOGRAM (N/A)  Patient Location: PACU  Anesthesia Type:General  Level of Consciousness: awake and alert   Airway & Oxygen Therapy: Patient Spontanous Breathing and Patient connected to face mask oxygen  Post-op Assessment: Report given to RN and Post -op Vital signs reviewed and stable  Post vital signs: Reviewed and stable  Last Vitals:  Filed Vitals:   06/06/15 0640 06/06/15 1210  BP: 126/51 140/72  Pulse: 58 69  Temp: 36.7 C 37.3 C  Resp: 16 16    Complications: No apparent anesthesia complications

## 2015-06-06 NOTE — Interval H&P Note (Signed)
History and Physical Interval Note:  06/06/2015 1:03 PM  Gilbert Cook  has presented today for surgery, with the diagnosis of [place]  The various methods of treatment have been discussed with the patient and family. After consideration of risks, benefits and other options for treatment, the patient has consented to  Procedure(s): LAPAROSCOPIC CHOLECYSTECTOMY WITH INTRAOPERATIVE CHOLANGIOGRAM (N/A) as a surgical intervention .  The patient's history has been reviewed, patient examined, no change in status, stable for surgery.  I have reviewed the patient's chart and labs.  Questions were answered to the patient's satisfaction.     [place]

## 2015-06-07 MED ORDER — OXYCODONE-ACETAMINOPHEN 7.5-325 MG PO TABS: 2.0000 | ORAL_TABLET | ORAL | Status: DC | PRN

## 2015-06-07 MED ORDER — GABAPENTIN 400 MG PO CAPS: 400.0000 mg | ORAL_CAPSULE | Freq: Three times a day (TID) | ORAL | Status: DC

## 2015-06-07 MED ORDER — OXYCODONE-ACETAMINOPHEN 7.5-325 MG PO TABS
2.0000 | ORAL_TABLET | ORAL | Status: DC | PRN
  Administered 2015-06-07: 2 via ORAL
  Filled 2015-06-07: qty 2

## 2015-06-07 NOTE — Discharge Summary (Signed)
Patient ID: Gilbert Cook MRN: [id number] DOB/AGE: 1985/04/10 30 y.o.  Admit date: 06/04/2015 Discharge date: 06/07/2015   Discharge Diagnoses:  Active Problems:   Gallstone   Procedures: Laparoscopic cholecystectomy  Hospital Course: 30 year old male admitted for persistent right upper quadrant pain  related to a symptomatic cholelithiasis, he was unable to control his pain at home and therefore needed admission. He was kept in the hospital for IV pain medication IV hydration and scheduled for laparoscopic cholecystectomy. He underwent an uneventful laparoscopic cholecystectomy and was kept overnight at time of discharge he was ambulating, tolerating diet and h his abdomen was soft with incision is healing adequately. 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 I will prescribe in addition gabapentin to try to control this neuralgia. Condition of the patient at time of discharge is stable. Please note that I provided extensive counseling to the family regarding his condition    Disposition: 01-Home or Self Care  Discharge Instructions    Call MD for:  difficulty breathing, headache or visual disturbances    Complete by:  As directed      Call MD for:  extreme fatigue    Complete by:  As directed      Call MD for:  hives    Complete by:  As directed      Call MD for:  persistant dizziness or light-headedness    Complete by:  As directed      Call MD for:  persistant nausea and vomiting    Complete by:  As directed      Call MD for:  redness, tenderness, or signs of infection (pain, swelling, redness, odor or green/yellow discharge around incision site)    Complete by:  As directed      Call MD for:  severe uncontrolled pain    Complete by:  As directed      Call MD for:  temperature >100.4    Complete by:  As directed      Diet  - low sodium heart healthy    Complete by:  As directed      Discharge instructions    Complete by:  As directed   DC home, may shower tomorrow.     Increase activity slowly    Complete by:  As directed      Leave dressing on - Keep it clean, dry, and intact until clinic visit    Complete by:  As directed      Lifting restrictions    Complete by:  As directed   20 lbs 6 weeks     No dressing needed    Complete by:  As directed             Medication List    STOP taking these medications        oxyCODONE-acetaminophen 5-325 MG tablet  Commonly known as:  ROXICET  Replaced by:  oxyCODONE-acetaminophen 7.5-325 MG tablet      TAKE these medications        gabapentin 400 MG capsule  Commonly known as:  NEURONTIN  Take 1 capsule (400 mg total) by mouth 3 (three) times daily.     multivitamin tablet  Take 1 tablet by mouth daily.     ondansetron 4 MG disintegrating tablet  Commonly known as:  ZOFRAN ODT  Take  1 tablet (4 mg total) by mouth every 8 (eight) hours as needed for nausea or vomiting.     oxyCODONE-acetaminophen 7.5-325 MG tablet  Commonly known as:  PERCOCET  Take 2 tablets by mouth every 4 (four) hours as needed for moderate pain.          Caroleen Hamman, MD FACS

## 2015-06-07 NOTE — Discharge Instructions (Signed)

## 2015-06-10 ENCOUNTER — Ambulatory Visit: Payer: Self-pay | Admitting: Surgery

## 2015-06-10 LAB — SURGICAL PATHOLOGY

## 2015-06-11 NOTE — Anesthesia Postprocedure Evaluation (Signed)
Anesthesia Post Note  Patient: TOAN MUSKA  Procedure(s) Performed: Procedure(s) (LRB): LAPAROSCOPIC CHOLECYSTECTOMY WITH INTRAOPERATIVE CHOLANGIOGRAM (N/A)  Patient location during evaluation: PACU Anesthesia Type: General Level of consciousness: awake and alert Pain management: pain level controlled Vital Signs Assessment: post-procedure vital signs reviewed and stable Respiratory status: spontaneous breathing, nonlabored ventilation, respiratory function stable and patient connected to nasal cannula oxygen Cardiovascular status: blood pressure returned to baseline and stable Postop Assessment: no signs of nausea or vomiting Anesthetic complications: no    Last Vitals:  Filed Vitals:   06/06/15 2102 06/07/15 0536  BP: 140/72 123/66  Pulse: 90 51  Temp: 36.8 C 36.5 C  Resp: 18 16    Last Pain:  Filed Vitals:   06/07/15 1038  PainSc: 6                  Martha Clan

## 2015-06-18 NOTE — Telephone Encounter (Signed)
Called patient and told him date/time of appointment

## 2015-06-24 ENCOUNTER — Encounter: Payer: Self-pay | Admitting: Surgery

## 2015-06-25 ENCOUNTER — Encounter: Payer: Self-pay | Admitting: Surgery

## 2015-06-25 ENCOUNTER — Ambulatory Visit (INDEPENDENT_AMBULATORY_CARE_PROVIDER_SITE_OTHER): Payer: BLUE CROSS/BLUE SHIELD | Admitting: Surgery

## 2015-06-25 VITALS — BP 162/75 | HR 72 | Temp 97.6°F | Ht 72.0 in | Wt 177.0 lb

## 2015-06-25 DIAGNOSIS — Z09 Encounter for follow-up examination after completed treatment for conditions other than malignant neoplasm: Secondary | ICD-10-CM

## 2015-06-25 NOTE — Patient Instructions (Signed)
Please call our office if you have any questions or concerns.  

## 2015-06-26 ENCOUNTER — Encounter: Payer: Self-pay | Admitting: Surgery

## 2015-06-26 NOTE — Progress Notes (Signed)
S/p lap chole, doing well , no complaints path d/w pt  AVSS  PE NAD Abd: soft, NT, incisions c/d/i.  A/P doing well RTC prn  No heavy lifting

## 2015-06-27 IMAGING — CR DG KNEE COMPLETE 4+V*R*
4 series · 4 of 4 positions shown · non-contrast
Comparison: None.

CLINICAL DATA: Pain for 1 year

EXAM:
RIGHT KNEE - COMPLETE 4+ VIEW

[view not recorded (1 of 4)]
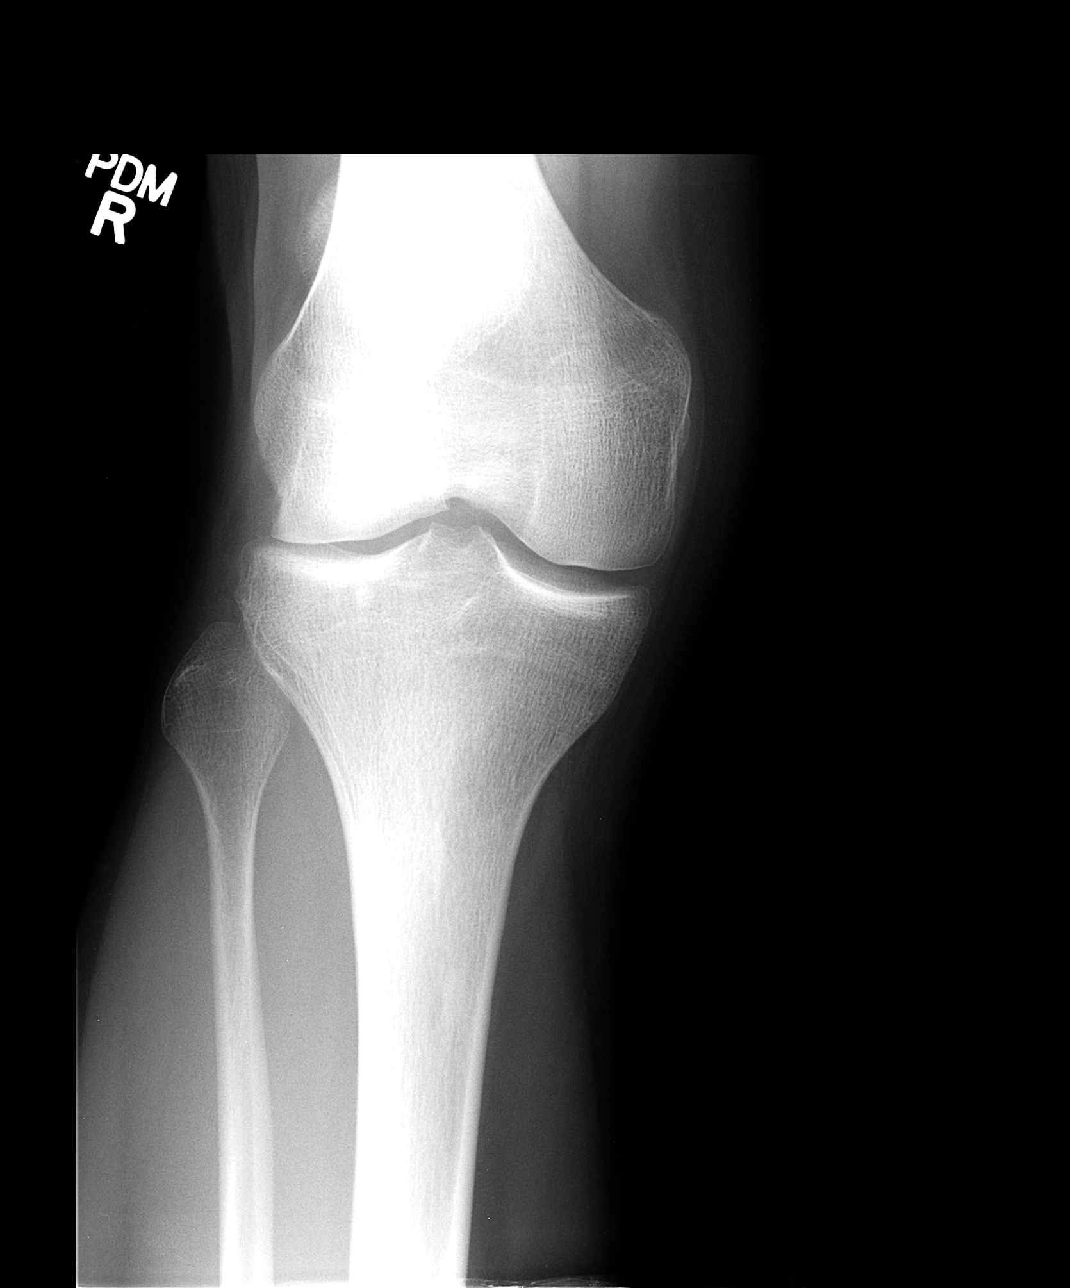

[view not recorded (2 of 4)]
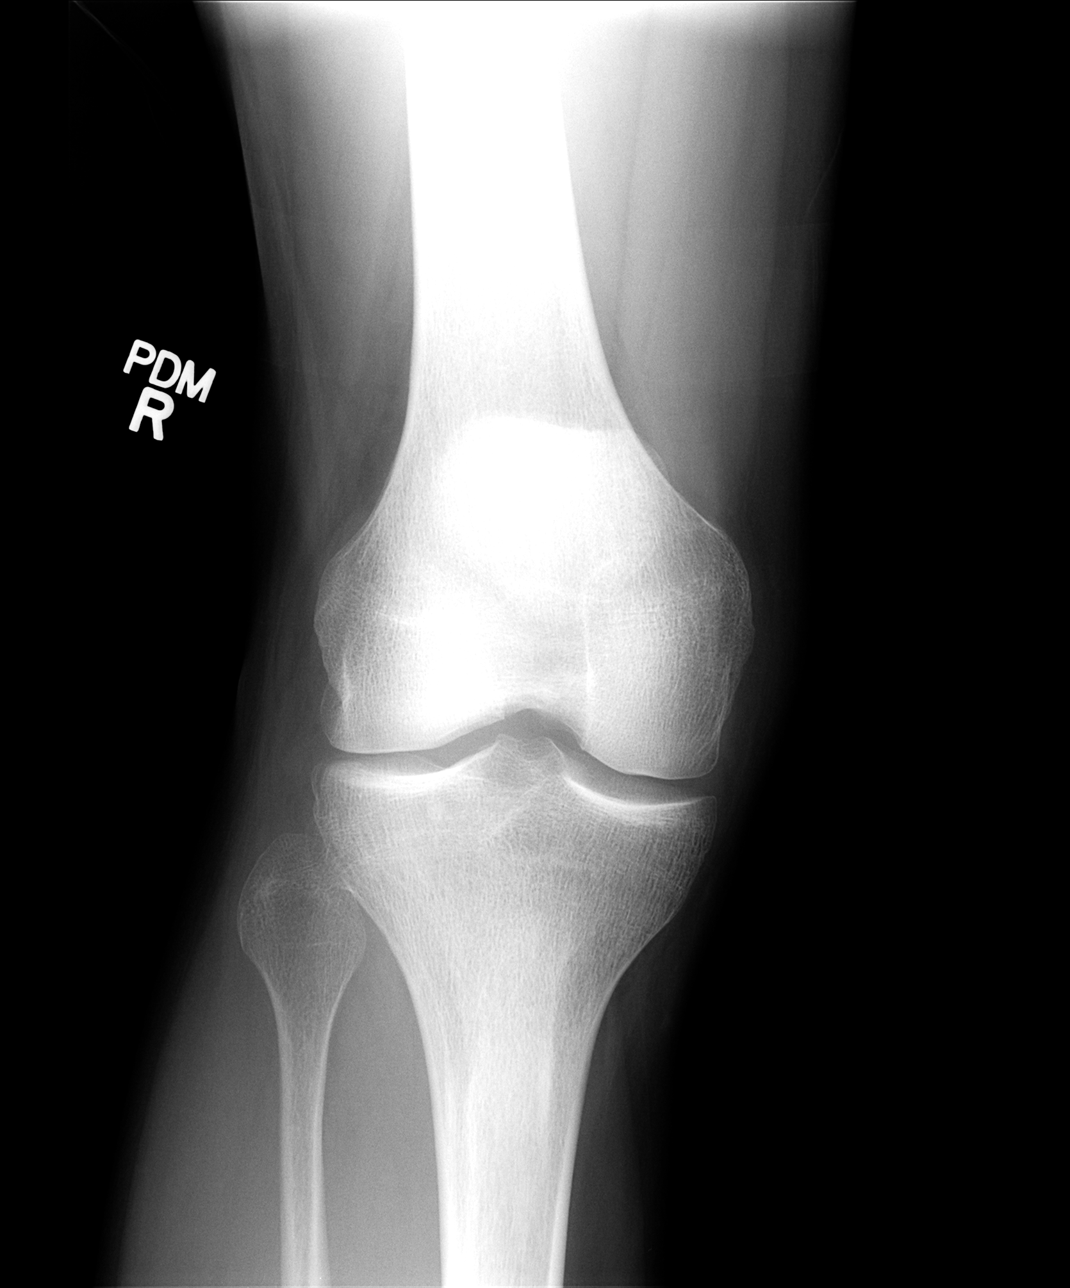

[view not recorded (3 of 4)]
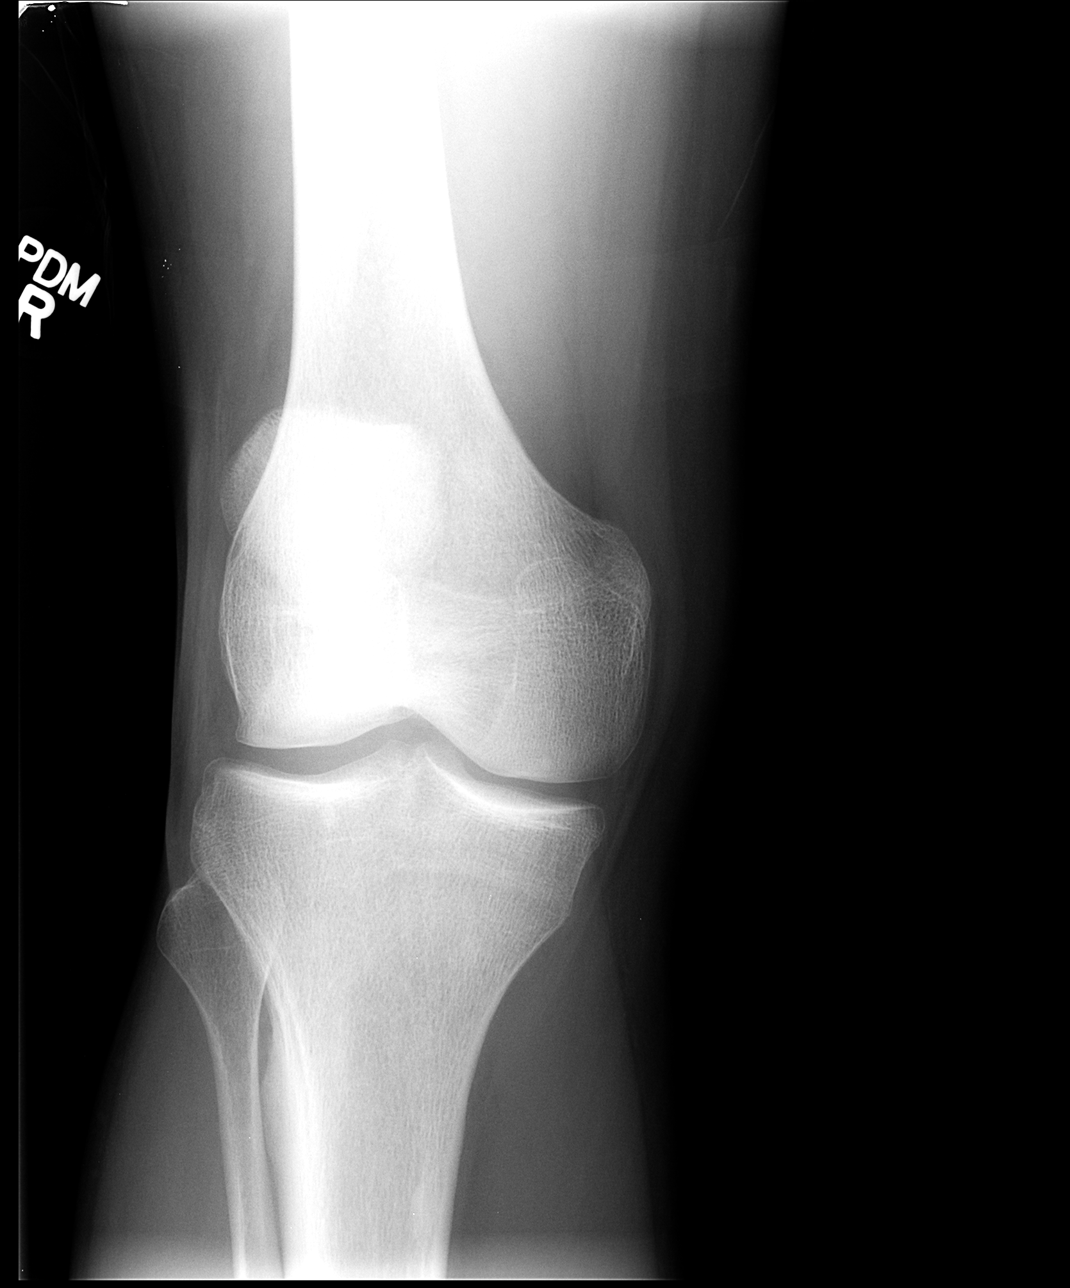

[view not recorded (4 of 4)]
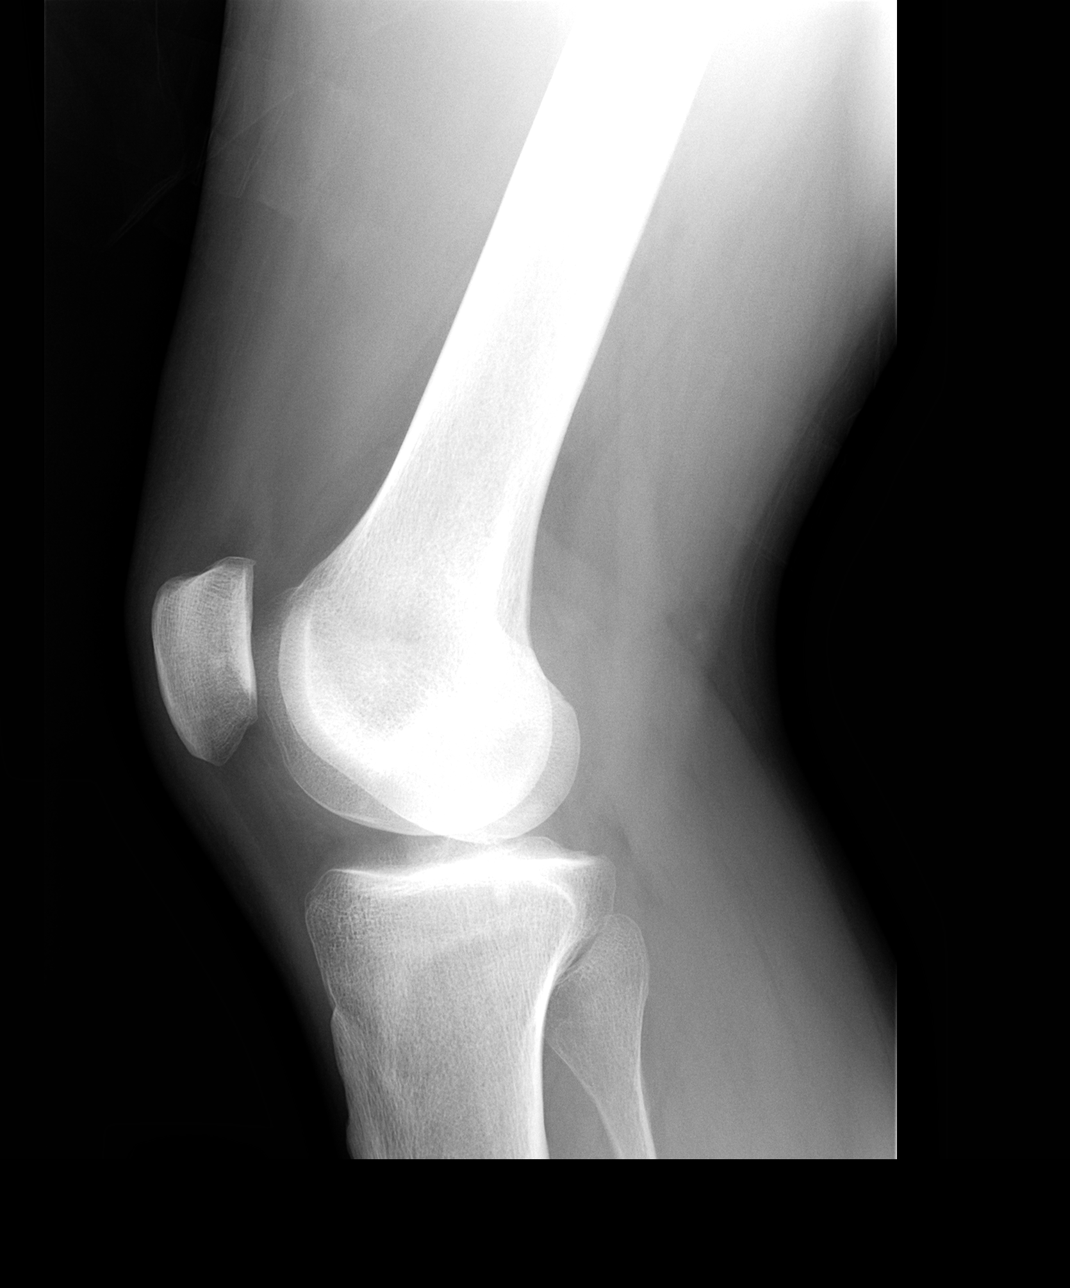

[4 of 4 positions shown; findings below may reference images not displayed]

FINDINGS: Frontal, lateral, and bilateral oblique views were obtained. There
is no fracture, dislocation, or effusion. Joint spaces appear
intact. No erosive change.
IMPRESSION: No abnormality noted.

## 2016-04-16 ENCOUNTER — Telehealth: Payer: Self-pay | Admitting: General Practice

## 2016-04-16 NOTE — Telephone Encounter (Signed)
See prev note

## 2016-04-16 NOTE — Telephone Encounter (Signed)
I guess technically he is still my patient since 3 years has not passed since his last visit,  Only 2.5  , you can schedule him bit I will not "work him in" ahead of other patients.

## 2016-04-16 NOTE — Telephone Encounter (Signed)
Pt wife called and was looking to make an appt for pt. He has not been seen by by Dr. Derrel Nip since 7/16/1. Please advise, thank you!  Call pt @ [phone]

## 2016-04-17 NOTE — Telephone Encounter (Signed)
See prev note from Dr. Derrel Nip

## 2016-04-23 ENCOUNTER — Ambulatory Visit (INDEPENDENT_AMBULATORY_CARE_PROVIDER_SITE_OTHER): Payer: BC Managed Care – PPO | Admitting: Surgery

## 2016-04-23 ENCOUNTER — Other Ambulatory Visit
Admission: RE | Admit: 2016-04-23 | Discharge: 2016-04-23 | Disposition: A | Discharge status: Home / Self Care | Payer: BC Managed Care – PPO | Source: Ambulatory Visit | Attending: Internal Medicine | Admitting: Internal Medicine

## 2016-04-23 ENCOUNTER — Encounter: Payer: Self-pay | Admitting: Surgery

## 2016-04-23 ENCOUNTER — Telehealth: Payer: Self-pay

## 2016-04-23 VITALS — BP 127/84 | HR 75 | Temp 98.3°F | Wt 195.0 lb

## 2016-04-23 DIAGNOSIS — R1012 Left upper quadrant pain: Secondary | ICD-10-CM

## 2016-04-23 LAB — COMPREHENSIVE METABOLIC PANEL
ALT: 27 U/L (ref 17–63)
AST: 23 U/L (ref 15–41)
Albumin: 4.7 g/dL (ref 3.5–5.0)
Alkaline Phosphatase: 60 U/L (ref 38–126)
Anion gap: 6 (ref 5–15)
BILIRUBIN TOTAL: 0.7 mg/dL (ref 0.3–1.2)
BUN: 19 mg/dL (ref 6–20)
CO2: 28 mmol/L (ref 22–32)
CREATININE: 1.22 mg/dL (ref 0.61–1.24)
Calcium: 9 mg/dL (ref 8.9–10.3)
Chloride: 105 mmol/L (ref 101–111)
GFR calc Af Amer: >60 mL/min (ref >60)
Glucose, Bld: 98 mg/dL (ref 65–99)
POTASSIUM: 4.1 mmol/L (ref 3.5–5.1)
Sodium: 139 mmol/L (ref 135–145)
TOTAL PROTEIN: 7.5 g/dL (ref 6.5–8.1)

## 2016-04-23 LAB — CBC WITH DIFFERENTIAL/PLATELET
BASOS ABS: 0 10*3/uL (ref 0–0.1)
Basophils Relative: 0 %
Eosinophils Absolute: 0.3 10*3/uL (ref 0–0.7)
Eosinophils Relative: 3 %
HEMATOCRIT: 46.8 % (ref 40.0–52.0)
Hemoglobin: 15.8 g/dL (ref 13.0–18.0)
LYMPHS ABS: 3.5 10*3/uL (ref 1.0–3.6)
LYMPHS PCT: 37 %
MCH: 29 pg (ref 26.0–34.0)
MCHC: 33.8 g/dL (ref 32.0–36.0)
MCV: 85.8 fL (ref 80.0–100.0)
MONO ABS: 0.6 10*3/uL (ref 0.2–1.0)
Monocytes Relative: 6 %
NEUTROS ABS: 5 10*3/uL (ref 1.4–6.5)
Neutrophils Relative %: 54 %
Platelets: 224 10*3/uL (ref 150–440)
RBC: 5.46 MIL/uL (ref 4.40–5.90)
RDW: 13.2 % (ref 11.5–14.5)
WBC: 9.3 10*3/uL (ref 3.8–10.6)

## 2016-04-23 NOTE — Telephone Encounter (Signed)
Called patient and was not able to leave a message on his phone since it was full. I will try to call him back tomorrow morning.

## 2016-04-23 NOTE — Patient Instructions (Addendum)
Please go to the [facility] and have your labs drawn today. We will call you with the results.   Please go to your appointment with Dr. Derrel Nip on Monday 04/27/2016 at 4:00 PM. Please arrive 15 minutes prior to your appointment.

## 2016-04-24 NOTE — Telephone Encounter (Signed)
Patient is returning a phone call. I saw in the previous notes that it's about his lab results. Patient states that he may not be in an area that has service so you can leave him a message on his cell phone if he does not pick up. Please call patient and advice.

## 2016-04-24 NOTE — Telephone Encounter (Signed)
Returned phone call to patient at this time. Results reviewed with patient. He verbalizes understanding.

## 2016-04-24 NOTE — Progress Notes (Signed)
Outpatient Surgical Follow Up  04/24/2016  Gilbert Cook is an 31 y.o. male.   Chief Complaint  Patient presents with  . Other    HPI: Gilbert Cook is a 31 yo male well known to or practice s/p uncomplicated lap chole close to 11 months ago. Now comes in for LUQ pain and chest pain. Pain is intermittent, dull and moderate in intensity.  Pain radiates and to the back and to the chest as well. He does have some palpitation and some migraines as well. And no evidence of biliary obstruction. No fevers no chills. He is taking by mouth and having bowel movements.  Past Medical History:  Diagnosis Date  . Hypercholesteremia     Past Surgical History:  Procedure Laterality Date  . CHOLECYSTECTOMY N/A 06/06/2015   Procedure: LAPAROSCOPIC CHOLECYSTECTOMY WITH INTRAOPERATIVE CHOLANGIOGRAM;  Surgeon: Jules Husbands, MD;  Location: [facility] ORS;  Service: General;  Laterality: N/A;  . FRACTURE SURGERY Right 2013   wrist (Screw)    Family History  Problem Relation Age of Onset  . Hypertension Mother   . Hypertension Father   . Cancer Father 66    T cell leukemia  . Hypertension Maternal Grandmother   . Hypertension Maternal Grandfather   . Hypertension Paternal Grandmother   . Hypertension Paternal Grandfather   . Depression Maternal Uncle   . Schizophrenia Maternal Uncle     Social History:  reports that he quit smoking about 5 years ago. His smoking use included Cigarettes. He has a 2.50 pack-year smoking history. He has never used smokeless tobacco. He reports that he drinks alcohol. He reports that he uses drugs, including Marijuana.  Allergies: No Known Allergies  Medications reviewed.    ROS  Full ROS performance otherwise negative  BP 127/84   Pulse 75   Temp 98.3 F (36.8 C) (Oral)   Wt 88.5 kg (195 lb)   BMI 26.45 kg/m   Physical Exam  Constitutional: He is oriented to person, place, and time and well-developed, well-nourished, and in no distress. No distress.  Eyes:  Right eye exhibits no discharge. No scleral icterus.  Neck: No JVD present. No tracheal deviation present.  Cardiovascular: Normal rate and intact distal pulses.   Pulmonary/Chest: Effort normal. No respiratory distress. He exhibits no tenderness.  Abdominal: Soft. He exhibits no distension. There is no tenderness. There is no rebound and no guarding.  Musculoskeletal: Normal range of motion. He exhibits no edema.  Neurological: He is alert and oriented to person, place, and time. Gait normal. GCS score is 15.  Skin: Skin is warm and dry.  Psychiatric: Mood, memory, affect and judgment normal.  Nursing note and vitals reviewed.      No results found for this or any previous visit (from the past 48 hour(s)). No results found.  Assessment/Plan:  1. Left upper quadrant pain - CBC with Differential; Future - Comprehensive metabolic panel; Future Pain is likely related to biliary disease or complications from his cholecystectomy. I discussed with him in detail about 40 workup including basically labs and LFTs. I also offered him to perform an right upper quadrant ultrasound and right now he does not want to incur in that expense and wishes to wait for labs and if those are abnormal he will get his ultrasound. I did encourage him to get further workup to rule out any potential biliary disease. An patient does not appear to be in any distress there is no evidence of obstructive jaundice or cholangitis. And we  will order labs and follow him up in a few weeks. I also encouraged him to see his primary care doctor as this can potentially be medically related or for that matter related to an anxiety disorder. Extensive counseling provided    Caroleen Hamman, MD [facility] General Surgeon  04/24/2016,9:08 AM

## 2016-04-27 ENCOUNTER — Ambulatory Visit (INDEPENDENT_AMBULATORY_CARE_PROVIDER_SITE_OTHER): Payer: BC Managed Care – PPO | Admitting: Internal Medicine

## 2016-04-27 ENCOUNTER — Encounter: Payer: Self-pay | Admitting: Internal Medicine

## 2016-04-27 ENCOUNTER — Ambulatory Visit (INDEPENDENT_AMBULATORY_CARE_PROVIDER_SITE_OTHER): Payer: BC Managed Care – PPO

## 2016-04-27 VITALS — BP 140/84 | HR 84 | Resp 16 | Wt 194.0 lb

## 2016-04-27 DIAGNOSIS — R002 Palpitations: Secondary | ICD-10-CM | POA: Diagnosis not present

## 2016-04-27 DIAGNOSIS — R0789 Other chest pain: Secondary | ICD-10-CM

## 2016-04-27 DIAGNOSIS — R1012 Left upper quadrant pain: Secondary | ICD-10-CM | POA: Diagnosis not present

## 2016-04-27 DIAGNOSIS — R079 Chest pain, unspecified: Secondary | ICD-10-CM | POA: Diagnosis not present

## 2016-04-27 DIAGNOSIS — M7631 Iliotibial band syndrome, right leg: Secondary | ICD-10-CM

## 2016-04-27 DIAGNOSIS — Z23 Encounter for immunization: Secondary | ICD-10-CM | POA: Diagnosis not present

## 2016-04-27 MED ORDER — METOPROLOL SUCCINATE ER 25 MG PO TB24: 25.0000 mg | ORAL_TABLET | Freq: Every day | ORAL | 3 refills | Status: DC

## 2016-04-27 MED ORDER — PANTOPRAZOLE SODIUM 40 MG PO TBEC: 40.0000 mg | DELAYED_RELEASE_TABLET | Freq: Every day | ORAL | 3 refills | Status: DC

## 2016-04-27 NOTE — Patient Instructions (Signed)
METOPROLOL 25 MG DAILY IN THE EVENING FOR PALPITATIONS  PROTONIX ONCE DAILY IN THE MORNING FOR REFLUX  CARDIOLOGY REFERRAL FOR EVALUATION OF PALPITATIONS

## 2016-04-27 NOTE — Progress Notes (Signed)
Pre visit review using our clinic review tool, if applicable. No additional management support is needed unless otherwise documented below in the visit note. 

## 2016-04-27 NOTE — Progress Notes (Signed)
Subjective:  Patient ID: Gilbert Cook, male    DOB: Jun 05, 1985  Age: 31 y.o. MRN: [id number]  CC: The primary encounter diagnosis was LUQ pain. Diagnoses of Chest wall pain, Palpitations, Left sided chest pain, Encounter for immunization, and Iliotibial band syndrome of right side were also pertinent to this visit.  HPI Gilbert Cook presents for REESTABLISHMENT OF CARE.  LAST SEEN jULY 2015  LAP CHOLE  April 2017  TEN DAYS AGO : STARTED HAVING frontal HEADACHES RIGHT SIDED DESCRIBED AS PRESSURE ,  NOT LIGHT SENSITIVE,  SLIGHT NAUSEA AND DIZZINESS. NO VISUAL changes.  Also started having PALPITATIONS AND ANXIETY AND LEFT LATERAL CHEST wall PAIN . GETS DIAPHORETIC AND FLUSHED BUT NO FEVER.   EPISODES OCCURRED DAILY UNTIL Last  Friday ,when he saw the general surgeon who dis his cole 1 yr ago.   HAVE BEEN LESS FREQUENT SINCE THEN SAW  GENERAL SURGEON PABON  ON Friday  LABS WERE NORMAL ,  Ultrasound was advised but deferred by patient . Marland Kitchen   SYMPTOMS have been occurring at rest.  Does not occur after eating and has not occurred during sleep.  History of tobacco abuse, QUIT 5-10 YEARS AGO, 7 pack year history .  DRINKS 3 BEERS EVERY NIGHT 4-5 ON THE WEEKENDS.  MOTHER had CAD AT AGE 57,  NON SMOKER paternal GF had a  CABG .     Outpatient Medications Prior to Visit  Medication Sig Dispense Refill  . aspirin EC 81 MG tablet Take 81 mg by mouth daily.     No facility-administered medications prior to visit.     Review of Systems;  Patient denies headache, fevers, malaise, unintentional weight loss, skin rash, eye pain, sinus congestion and sinus pain, sore throat, dysphagia,  hemoptysis , cough, dyspnea, wheezing, , orthopnea, edema, abdominal pain, nausea, melena, diarrhea, constipation, flank pain, dysuria, hematuria, urinary  Frequency, nocturia, numbness, tingling, seizures,  Focal weakness, Loss of consciousness,  Tremor, insomnia, depression and suicidal ideation.       Objective:  BP 140/84   Pulse 84   Resp 16   Wt 194 lb (88 kg)   SpO2 96%   BMI 26.31 kg/m   BP Readings from Last 3 Encounters:  04/27/16 140/84  04/23/16 127/84  06/25/15 (!) 162/75    Wt Readings from Last 3 Encounters:  04/27/16 194 lb (88 kg)  04/23/16 195 lb (88.5 kg)  06/25/15 177 lb (80.3 kg)    General appearance: alert, cooperative and appears stated age Ears: normal TM's and external ear canals both ears Throat: lips, mucosa, and tongue normal; teeth and gums normal Neck: no adenopathy, no carotid bruit, supple, symmetrical, trachea midline and thyroid not enlarged, symmetric, no tenderness/mass/nodules Back: symmetric, no curvature. ROM normal. No CVA tenderness. Lungs: clear to auscultation bilaterally Heart: regular rate and rhythm, S1, S2 normal, no murmur, click, rub or gallop Abdomen: soft, non-tender; bowel sounds normal; no masses,  no organomegaly Pulses: 2+ and symmetric Skin: Skin color, texture, turgor normal. No rashes or lesions Lymph nodes: Cervical, supraclavicular, and axillary nodes normal. Psych: affect anxious  makes good eye contact. No fidgeting,   No results found for: HGBA1C  Lab Results  Component Value Date   CREATININE 1.02 04/27/2016   CREATININE 1.22 04/23/2016   CREATININE 1.02 06/04/2015    Lab Results  Component Value Date   WBC 9.3 04/23/2016   HGB 15.8 04/23/2016   HCT 46.8 04/23/2016   PLT 224 04/23/2016   GLUCOSE  81 04/27/2016   CHOL 262 (H) 04/27/2016   TRIG 200.0 (H) 04/27/2016   HDL 50.50 04/27/2016   LDLDIRECT 197.3 12/14/2012   LDLCALC 171 (H) 04/27/2016   ALT 23 04/27/2016   AST 18 04/27/2016   NA 140 04/27/2016   K 4.1 04/27/2016   CL 103 04/27/2016   CREATININE 1.02 04/27/2016   BUN 13 04/27/2016   CO2 29 04/27/2016   TSH 3.45 04/27/2016    No results found.  Assessment & Plan:   Problem List Items Addressed This Visit    Chest wall pain    Pain is atypical for ischemia.  EKG nornal.   Chest x ray suggests reactive airway disease .  Trial of PPI for reflux      Relevant Orders   EKG 12-Lead (Completed)   DG Chest 2 View (Completed)   CK (Creatine Kinase) (Completed)   Lipid panel (Completed)   RESOLVED: Iliotibial band syndrome of right side   Palpitations    Starting metoprolol 25 mg bid.  Referral to cardiology for Holter monitor       Relevant Orders   TSH (Completed)   Comprehensive metabolic panel (Completed)    Other Visit Diagnoses    LUQ pain    -  Primary   Relevant Orders   Lipase (Completed)   Left sided chest pain       Relevant Orders   Ambulatory referral to Cardiology   Encounter for immunization       Relevant Orders   Flu Vaccine QUAD 36+ mos IM (Completed)      I am having Mr. Boardley start on metoprolol succinate and pantoprazole. I am also having him maintain his aspirin EC.  Meds ordered this encounter  Medications  . metoprolol succinate (TOPROL-XL) 25 MG 24 hr tablet    Sig: Take 1 tablet (25 mg total) by mouth daily.    Dispense:  90 tablet    Refill:  3  . pantoprazole (PROTONIX) 40 MG tablet    Sig: Take 1 tablet (40 mg total) by mouth daily.    Dispense:  30 tablet    Refill:  3    There are no discontinued medications.  Follow-up: Return in about 4 weeks (around 05/25/2016).   Crecencio Mc, MD

## 2016-04-28 DIAGNOSIS — R002 Palpitations: Secondary | ICD-10-CM | POA: Insufficient documentation

## 2016-04-28 DIAGNOSIS — R0789 Other chest pain: Secondary | ICD-10-CM | POA: Insufficient documentation

## 2016-04-28 LAB — COMPREHENSIVE METABOLIC PANEL
ALBUMIN: 4.8 g/dL (ref 3.5–5.2)
ALT: 23 U/L (ref 0–53)
AST: 18 U/L (ref 0–37)
Alkaline Phosphatase: 62 U/L (ref 39–117)
BILIRUBIN TOTAL: 0.6 mg/dL (ref 0.2–1.2)
BUN: 13 mg/dL (ref 6–23)
CALCIUM: 9.6 mg/dL (ref 8.4–10.5)
CO2: 29 mEq/L (ref 19–32)
CREATININE: 1.02 mg/dL (ref 0.40–1.50)
Chloride: 103 mEq/L (ref 96–112)
GFR: 89.4 mL/min (ref >60.00)
Glucose, Bld: 81 mg/dL (ref 70–99)
Potassium: 4.1 mEq/L (ref 3.5–5.1)
Sodium: 140 mEq/L (ref 135–145)
Total Protein: 7.2 g/dL (ref 6.0–8.3)

## 2016-04-28 LAB — LIPID PANEL
CHOLESTEROL: 262 mg/dL — AB (ref 0–200)
HDL: 50.5 mg/dL (ref >39.00)
LDL Cholesterol: 171 mg/dL — ABNORMAL HIGH (ref 0–99)
NonHDL: 211.47
TRIGLYCERIDES: 200 mg/dL — AB (ref 0.0–149.0)
Total CHOL/HDL Ratio: 5
VLDL: 40 mg/dL (ref 0.0–40.0)

## 2016-04-28 LAB — LIPASE: Lipase: 20 U/L (ref 11.0–59.0)

## 2016-04-28 LAB — TSH: TSH: 3.45 u[IU]/mL (ref 0.35–4.50)

## 2016-04-28 LAB — CK: Total CK: 135 U/L (ref 7–232)

## 2016-04-28 NOTE — Assessment & Plan Note (Addendum)
Pain is atypical for ischemia and his EKG reveals no normal.  Chest x ray suggests reactive airway disease .  Trial of PPI for reflux

## 2016-04-28 NOTE — Assessment & Plan Note (Signed)
Starting metoprolol 25 mg bid.  Referral to cardiology for Holter monitor

## 2016-04-29 ENCOUNTER — Encounter: Payer: Self-pay | Admitting: Internal Medicine

## 2016-04-30 ENCOUNTER — Encounter: Payer: Self-pay | Admitting: *Deleted

## 2016-05-07 ENCOUNTER — Ambulatory Visit (INDEPENDENT_AMBULATORY_CARE_PROVIDER_SITE_OTHER): Payer: BC Managed Care – PPO | Admitting: Cardiology

## 2016-05-07 ENCOUNTER — Encounter: Payer: Self-pay | Admitting: Cardiology

## 2016-05-07 VITALS — BP 142/92 | HR 66 | Ht 72.0 in | Wt 194.0 lb

## 2016-05-07 DIAGNOSIS — I10 Essential (primary) hypertension: Secondary | ICD-10-CM

## 2016-05-07 DIAGNOSIS — R079 Chest pain, unspecified: Secondary | ICD-10-CM | POA: Diagnosis not present

## 2016-05-07 DIAGNOSIS — R002 Palpitations: Secondary | ICD-10-CM | POA: Diagnosis not present

## 2016-05-07 NOTE — Patient Instructions (Addendum)
Testing/Procedures: Your physician has requested that you have an echocardiogram. Echocardiography is a painless test that uses sound waves to create images of your heart. It provides your doctor with information about the size and shape of your heart and how well your heart's chambers and valves are working. This procedure takes approximately one hour. There are no restrictions for this procedure.  Your physician has requested that you have a renal artery duplex. During this test, an ultrasound is used to evaluate blood flow to the kidneys. Allow one hour for this exam. Do not eat after midnight the day before and avoid carbonated beverages. Take your medications as you usually do.  Your physician has recommended that you wear an event monitor. Event monitors are medical devices that record the heart's electrical activity. Doctors most often [place] these monitors to diagnose arrhythmias. Arrhythmias are problems with the speed or rhythm of the heartbeat. The monitor is a small, portable device. You can wear one while you do your normal daily activities. This is usually used to diagnose what is causing palpitations/syncope (passing out).    Follow-Up: Your physician recommends that you schedule a follow-up appointment after testing with Dr. Yvone Neu.   It was a pleasure seeing you today here in the office. Please do not hesitate to give [place] a call back if you have any further questions. [place], BSN   Echocardiogram An echocardiogram, or echocardiography, uses sound waves (ultrasound) to produce an image of your heart. The echocardiogram is simple, painless, obtained within a short period of time, and offers valuable information to your health care provider. The images from an echocardiogram can provide information such as:  Evidence of coronary artery disease (CAD).  Heart size.  Heart muscle function.  Heart valve function.  Aneurysm detection.  Evidence of a past heart  attack.  Fluid buildup around the heart.  Heart muscle thickening.  Assess heart valve function. Tell a health care provider about:  Any allergies you have.  All medicines you are taking, including vitamins, herbs, eye drops, creams, and over-the-counter medicines.  Any problems you or family members have had with anesthetic medicines.  Any blood disorders you have.  Any surgeries you have had.  Any medical conditions you have.  Whether you are pregnant or may be pregnant. What happens before the procedure? No special preparation is needed. Eat and drink normally. What happens during the procedure?  In order to produce an image of your heart, gel will be applied to your chest and a wand-like tool (transducer) will be moved over your chest. The gel will help transmit the sound waves from the transducer. The sound waves will harmlessly bounce off your heart to allow the heart images to be captured in real-time motion. These images will then be recorded.  You may need an IV to receive a medicine that improves the quality of the pictures. What happens after the procedure? You may return to your normal schedule including diet, activities, and medicines, unless your health care provider tells you otherwise. This information is not intended to replace advice given to you by your health care provider. Make sure you discuss any questions you have with your health care provider. Document Released: 02/14/2000 Document Revised: 10/05/2015 Document Reviewed: 10/24/2012 Elsevier Interactive Patient Education  2017 [employer].  Cardiac Event Monitoring A cardiac event monitor is a small recording device that is used to detect abnormal heart rhythms (arrhythmias). The monitor is used to record your heart rhythm when  you have symptoms, such as:  Fast heartbeats (palpitations), such as heart racing or fluttering.  Dizziness.  Fainting or light-headedness.  Unexplained weakness. Some  monitors are wired to electrodes placed on your chest. Electrodes are flat, sticky disks that attach to your skin. Other monitors may be hand-held or worn on the wrist. The monitor can be worn for up to 30 days. If the monitor is attached to your chest, a technician will prepare your chest for the electrode placement and show you how to work the monitor. Take time to practice using the monitor before you leave the office. Make sure you understand how to send the information from the monitor to your health care provider. In some cases, you may need to use a landline telephone instead of a cell phone. What are the risks? Generally, this device is safe to use, but it possible that the skin under the electrodes will become irritated. How to use your cardiac event monitor  Wear your monitor at all times, except when you are in water:  Do not let the monitor get wet.  Take the monitor off when you bathe. Do not swim or use a hot tub with it on.  Keep your skin clean. Do not put body lotion or moisturizer on your chest.  Change the electrodes as told by your health care provider or any time they stop sticking to your skin. You may need to use medical tape to keep them on.  Try to put the electrodes in slightly different places on your chest to help prevent skin irritation. They must remain in the area under your left breast and in the upper right section of your chest.  Make sure the monitor is safely clipped to your clothing or in a location close to your body that your health care provider recommends.  Press the button to record as soon as you feel heart-related symptoms, such as:  Dizziness.  Weakness.  Light-headedness.  Palpitations.  Thumping or pounding in your chest.  Shortness of breath.  Unexplained weakness.  Keep a diary of your activities, such as walking, doing chores, and taking medicine. It is very important to note what you were doing when you pushed the button to record  your symptoms. This will help your health care provider determine what might be contributing to your symptoms.  Send the recorded information as recommended by your health care provider. It may take some time for your health care provider to process the results.  Change the batteries as told by your health care provider.  Keep electronic devices away from your monitor. This includes:  Tablets.  MP3 players.  Cell phones.  While wearing your monitor you should avoid:  Electric blankets.  Armed forces operational officer.  Electric toothbrushes.  Microwave ovens.  Magnets.  Metal detectors. Get help right away if:  You have chest pain.  You have extreme difficulty breathing or shortness of breath.  You develop a very fast heartbeat that persists.  You develop dizziness that does not go away.  You faint or constantly feel like you are about to faint. Summary  A cardiac event monitor is a small recording device that is used to help detect abnormal heart rhythms (arrhythmias).  The monitor is used to record your heart rhythm when you have heart-related symptoms.  Make sure you understand how to send the information from the monitor to your health care provider.  It is important to press the button on the monitor when you have any  heart-related symptoms.  Keep a diary of your activities, such as walking, doing chores, and taking medicine. It is very important to note what you were doing when you pushed the button to record your symptoms. This will help your health care provider learn what might be causing your symptoms. This information is not intended to replace advice given to you by your health care provider. Make sure you discuss any questions you have with your health care provider. Document Released: 11/26/2007 Document Revised: 02/01/2016 Document Reviewed: 02/01/2016 Elsevier Interactive Patient Education  2017 [employer].

## 2016-05-07 NOTE — Progress Notes (Signed)
Cardiology Office Note   Date:  05/07/2016   ID:  Gilbert Cook, DOB [birth year], MRN [id number]  Referring Doctor:  Crecencio Mc, MD   Cardiologist:   Wende Bushy, MD   Reason for consultation:  Chief Complaint  Patient presents with  . OTHER    C/o heart palpitations and elevated BP. Meds reviewed verbally with pt.      History of Present Illness: Gilbert Cook is a 31 y.o. male who presents for Palpitations.  3-4 weeks history of palpitations and feeling fluttering and rapid heart rate center of the chest. Nonradiating, sometimes moderate in intensity. Lasting a few minutes at a time. Patient unclear whether it has been daily in the past. Improved with metoprolol.  Also mentions some abdominal discomfort or chest discomfort that sometimes is associated with the palpitations.  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 No chest pain or shortness breath at that time.  Admits to increased stress recently. Not sure if this is affecting his symptoms.  Recently diagnosed with high blood pressure. There is history of high blood pressure in the family. Very concerned about family history of coronary disease.   ROS:  Please see the history of present illness. Aside from mentioned under HPI, all other systems are reviewed and negative.     Past Medical History:  Diagnosis Date  . Hypercholesteremia     Past Surgical History:  Procedure Laterality Date  . CHOLECYSTECTOMY N/A 06/06/2015   Procedure: LAPAROSCOPIC CHOLECYSTECTOMY WITH INTRAOPERATIVE CHOLANGIOGRAM;  Surgeon: Jules Husbands, MD;  Location: [facility] ORS;  Service: General;  Laterality: N/A;  . FRACTURE SURGERY Right 2013   wrist (Screw)     reports that he has been smoking Cigarettes.  He has a 2.50 pack-year smoking history. He has never used smokeless tobacco. He reports that he drinks alcohol. He reports that he does not use drugs.   family history includes Cancer (age of onset:  26) in his father; Depression in his maternal uncle; Heart attack in his maternal grandfather; Heart disease in his maternal grandfather and mother; Hypertension in his father, maternal grandfather, maternal grandmother, mother, paternal grandfather, and paternal grandmother; Schizophrenia in his maternal uncle.   Outpatient Medications Prior to Visit  Medication Sig Dispense Refill  . metoprolol succinate (TOPROL-XL) 25 MG 24 hr tablet Take 1 tablet (25 mg total) by mouth daily. 90 tablet 3  . pantoprazole (PROTONIX) 40 MG tablet Take 1 tablet (40 mg total) by mouth daily. 30 tablet 3  . aspirin EC 81 MG tablet Take 81 mg by mouth daily.     No facility-administered medications prior to visit.      Allergies: Patient has no known allergies.    PHYSICAL EXAM: VS:  BP (!) 142/92 (BP Location: Right Arm, Patient Position: Sitting, Cuff Size: Normal)   Pulse 66   Ht 6' (1.829 m)   Wt 194 lb (88 kg)   BMI 26.31 kg/m  , Body mass index is 26.31 kg/m. Wt Readings from Last 3 Encounters:  05/07/16 194 lb (88 kg)  04/27/16 194 lb (88 kg)  04/23/16 195 lb (88.5 kg)    GENERAL:  well developed, well nourished, not in acute distress HEENT: normocephalic, pink conjunctivae, anicteric sclerae, no xanthelasma, normal dentition, oropharynx clear NECK:  no neck vein engorgement, JVP normal, no hepatojugular reflux, carotid upstroke brisk and symmetric, no bruit, no thyromegaly, no lymphadenopathy LUNGS:  good respiratory effort, clear  to auscultation bilaterally CV:  PMI not displaced, no thrills, no lifts, S1 and S2 within normal limits, no palpable S3 or S4, no murmurs, no rubs, no gallops ABD:  Soft, nontender, nondistended, normoactive bowel sounds, no abdominal aortic bruit, no hepatomegaly, no splenomegaly MS: nontender back, no kyphosis, no scoliosis, no joint deformities EXT:  2+ DP/PT pulses, no edema, no varicosities, no cyanosis, no clubbing SKIN: warm, nondiaphoretic, normal turgor, no  ulcers NEUROPSYCH: alert, oriented to person, place, and time, sensory/motor grossly intact, normal mood, appropriate affect  Recent Labs: 04/23/2016: Hemoglobin 15.8; Platelets 224 04/27/2016: ALT 23; BUN 13; Creatinine, Ser 1.02; Potassium 4.1; Sodium 140; TSH 3.45   Lipid Panel    Component Value Date/Time   CHOL 262 (H) 04/27/2016 1709   TRIG 200.0 (H) 04/27/2016 1709   HDL 50.50 04/27/2016 1709   CHOLHDL 5 04/27/2016 1709   VLDL 40.0 04/27/2016 1709   LDLCALC 171 (H) 04/27/2016 1709   LDLDIRECT 197.3 12/14/2012 1642     Other studies Reviewed:  EKG:  The ekg from 05/07/2016 was personally reviewed by me and it revealed sinus rhythm, 66 BPM.  Additional studies/ records that were reviewed personally reviewed by me today include: None available   ASSESSMENT AND PLAN: Palpitations Recommend monitor. Recommend echocardiogram.  Hypertension Patient mentions that significant history of hypertension the family. May be began at a young age. Recommend workup for secondary causes of hypertension, PA-C/PRA. Plasma metanephrines. Renal artery ultrasound.  Abdominal discomfort/chest pain Sounds related to burping. Agree with PPI.  Patient very anxious about this recent diagnosis of hypertension and his symptoms of the palpitations and abdominal pain.. I explained that his symptoms are atypical for ischemia but if his symptoms do not improve with PPI, will recommend stress echo. Patient will keep [place] posted.   Current medicines are reviewed at length with the patient today.  The patient does not have concerns regarding medicines.  Labs/ tests ordered today include:  Orders Placed This Encounter  Procedures  . Metanephrines, plasma  . Aldosterone + renin activity w/ ratio  . LONG TERM MONITOR (3-14 DAYS)  . EKG 12-Lead  . ECHOCARDIOGRAM COMPLETE    I had a lengthy and detailed discussion with the patient regarding diagnoses, prognosis, diagnostic options.   Disposition:    FU with undersigned after tests   Thank you for this consultation. We will forwarding this consultation to referring physician.   Signed, Wende Bushy, MD  05/07/2016 6:07 PM    [place]  This note was generated in part with voice recognition software and I apologize for any typographical errors that were not detected and corrected.

## 2016-05-08 ENCOUNTER — Other Ambulatory Visit
Admission: RE | Admit: 2016-05-08 | Discharge: 2016-05-08 | Disposition: A | Discharge status: Home / Self Care | Payer: BC Managed Care – PPO | Source: Ambulatory Visit | Attending: Cardiology | Admitting: Cardiology

## 2016-05-08 DIAGNOSIS — R002 Palpitations: Secondary | ICD-10-CM

## 2016-05-08 DIAGNOSIS — R079 Chest pain, unspecified: Secondary | ICD-10-CM | POA: Insufficient documentation

## 2016-05-12 LAB — ALDOSTERONE + RENIN ACTIVITY W/ RATIO
ALDO / PRA Ratio: 5.7 (ref 0.0–30.0)
Aldosterone: 11 ng/dL (ref 0.0–30.0)
PRA LC/MS/MS: 1.943 ng/mL/h (ref 0.167–5.380)

## 2016-05-12 LAB — METANEPHRINES, PLASMA
METANEPHRINE FREE: 23 pg/mL (ref 0–62)
NORMETANEPHRINE FREE: 66 pg/mL (ref 0–145)

## 2016-05-20 ENCOUNTER — Encounter: Payer: Self-pay | Admitting: Internal Medicine

## 2016-05-20 ENCOUNTER — Ambulatory Visit (INDEPENDENT_AMBULATORY_CARE_PROVIDER_SITE_OTHER): Payer: BC Managed Care – PPO

## 2016-05-20 ENCOUNTER — Ambulatory Visit (INDEPENDENT_AMBULATORY_CARE_PROVIDER_SITE_OTHER): Payer: BC Managed Care – PPO | Admitting: Internal Medicine

## 2016-05-20 DIAGNOSIS — F411 Generalized anxiety disorder: Secondary | ICD-10-CM

## 2016-05-20 DIAGNOSIS — R079 Chest pain, unspecified: Secondary | ICD-10-CM

## 2016-05-20 DIAGNOSIS — R002 Palpitations: Secondary | ICD-10-CM

## 2016-05-20 DIAGNOSIS — R1010 Upper abdominal pain, unspecified: Secondary | ICD-10-CM

## 2016-05-20 DIAGNOSIS — I10 Essential (primary) hypertension: Secondary | ICD-10-CM

## 2016-05-20 MED ORDER — SERTRALINE HCL 50 MG PO TABS: 50.0000 mg | ORAL_TABLET | Freq: Every day | ORAL | 3 refills | Status: DC

## 2016-05-20 NOTE — Progress Notes (Unsigned)
Zio monitor placed at 08:55AM [id number]

## 2016-05-20 NOTE — Patient Instructions (Signed)
Bring your home blood pressure back for a Nurse Visit so we can determine if our home readings are accurate   Continue the daily protonix and daily metoprolol  I am recommending a trial of generic zoloft.  Please start the zoloft (sertraline)  at 1/2 tablet daily in the evening for the first few days to avoid nausea.  You can increase to a full tablet after a week if you have not developed side effects of nausea.  If the sertraline interferes with your sleep, take it in the morning with breakfast  instead  If the stomach is still bothering you in 4 weeks,  We will proceed with more testing to rule out hiatal hernia    Food Choices for Gastroesophageal Reflux Disease, Adult When you have gastroesophageal reflux disease (GERD), the foods you eat and your eating habits are very important. Choosing the right foods can help ease your discomfort. What guidelines do I need to follow?  Choose fruits, vegetables, whole grains, and low-fat dairy products.  Choose low-fat meat, fish, and poultry.  Limit fats such as oils, salad dressings, butter, nuts, and avocado.  Keep a food diary. This helps you identify foods that cause symptoms.  Avoid foods that cause symptoms. These may be different for everyone.  Eat small meals often instead of 3 large meals a day.  Eat your meals slowly, in a place where you are relaxed.  Limit fried foods.  Cook foods using methods other than frying.  Avoid drinking alcohol.  Avoid drinking large amounts of liquids with your meals.  Avoid bending over or lying down until 2-3 hours after eating. What foods are not recommended? These are some foods and drinks that may make your symptoms worse: Vegetables  Tomatoes. Tomato juice. Tomato and spaghetti sauce. Chili peppers. Onion and garlic. Horseradish. Fruits  Oranges, grapefruit, and lemon (fruit and juice). Meats  High-fat meats, fish, and poultry. This includes hot dogs, ribs, ham, sausage, salami, and  bacon. Dairy  Whole milk and chocolate milk. Sour cream. Cream. Butter. Ice cream. Cream cheese. Drinks  Coffee and tea. Bubbly (carbonated) drinks or energy drinks. Condiments  Hot sauce. Barbecue sauce. Sweets/Desserts  Chocolate and cocoa. Donuts. Peppermint and spearmint. Fats and Oils  High-fat foods. This includes [place] fries and potato chips. Other  Vinegar. Strong spices. This includes black pepper, white pepper, red pepper, cayenne, curry powder, cloves, ginger, and chili powder. The items listed above may not be a complete list of foods and drinks to avoid. Contact your dietitian for more information.  This information is not intended to replace advice given to you by your health care provider. Make sure you discuss any questions you have with your health care provider. Document Released: 08/18/2011 Document Revised: 07/25/2015 Document Reviewed: 12/21/2012 Elsevier Interactive Patient Education  2017 [employer].

## 2016-05-20 NOTE — Progress Notes (Signed)
Pre visit review using our clinic review tool, if applicable. No additional management support is needed unless otherwise documented below in the visit note. 

## 2016-05-20 NOTE — Progress Notes (Signed)
Patient ID: Gilbert Cook, male    DOB: [birth year]  Age: 31 y.o. MRN: [id number]  The patient is here for  management of  chronic and acute problems.  immunizations.    CC: Diagnoses of Pain of upper abdomen, Generalized anxiety disorder, and Essential hypertension were pertinent to this visit.  Headaches have improved.  Palpitations improved on metoprolol Elevated blood pressure readings at home averaging 140  Still getting nervous and irritable /anxiety Having more stomach pain described as sharp,  Right sided , intermittent ,occurring less than daily,  Sometimes after eating sometimes  Not,  Lasts 1-2 minutes.      Feels like the pain he had prior to the choelcystectomy Other times feels lit it's knotting up, and is constant   Still having acid reflux despite protonix   Anxiety for several years. Trial of buspar in Jan 2015 not tolerated due to "feeling fuzzy"    Palpitations and chest pain,  Metoprolol startred at last visit. And cardiology referral for holter monitor made.  Saw dr Vedia Pereyra,  2ndary causes of hypertension ruled out     History Gilbert Cook has a past medical history of Hypercholesteremia.   He has a past surgical history that includes Fracture surgery (Right, 2013) and Cholecystectomy (N/A, 06/06/2015).   His family history includes Cancer (age of onset: 35) in his father; Depression in his maternal uncle; Heart attack in his maternal grandfather; Heart disease in his maternal grandfather and mother; Hypertension in his father, maternal grandfather, maternal grandmother, mother, paternal grandfather, and paternal grandmother; Schizophrenia in his maternal uncle.He reports that he has been smoking Cigarettes.  He has a 2.50 pack-year smoking history. He has never used smokeless tobacco. He reports that he drinks alcohol. He reports that he does not use drugs.  Outpatient Medications Prior to Visit  Medication Sig Dispense Refill  . metoprolol succinate (TOPROL-XL) 25 MG 24 hr  tablet Take 1 tablet (25 mg total) by mouth daily. 90 tablet 3  . Multiple Vitamin (MULTIVITAMIN) tablet Take 1 tablet by mouth daily.    . pantoprazole (PROTONIX) 40 MG tablet Take 1 tablet (40 mg total) by mouth daily. 30 tablet 3  . aspirin EC 81 MG tablet Take 81 mg by mouth daily.     No facility-administered medications prior to visit.     Review of Systems   Patient denies headache, fevers, malaise, unintentional weight loss, skin rash, eye pain, sinus congestion and sinus pain, sore throat, dysphagia,  hemoptysis , cough, dyspnea, wheezing, chest pain, palpitations, orthopnea, edema, , nausea, melena, diarrhea, constipation, flank pain, dysuria, hematuria, urinary  Frequency, nocturia, numbness, tingling, seizures,  Focal weakness, Loss of consciousness,  Tremor, insomnia, depression,  and suicidal ideation.     Objective:  BP 120/86 (BP Location: Left Arm, Patient Position: Sitting, Cuff Size: Normal)   Pulse 70   Temp 98 F (36.7 C) (Oral)   Resp 16   Ht 6' (1.829 m)   Wt 193 lb 9.6 oz (87.8 kg)   SpO2 97%   BMI 26.26 kg/m   Physical Exam  General appearance: alert, cooperative and appears stated age Ears: normal TM's and external ear canals both ears Throat: lips, mucosa, and tongue normal; teeth and gums normal Neck: no adenopathy, no carotid bruit, supple, symmetrical, trachea midline and thyroid not enlarged, symmetric, no tenderness/mass/nodules Back: symmetric, no curvature. ROM normal. No CVA tenderness. Lungs: clear to auscultation bilaterally Heart: regular rate and rhythm, S1, S2 normal, no murmur, click, rub or gallop  Abdomen: soft, non-tender; bowel sounds normal; no masses,  no organomegaly Pulses: 2+ and symmetric Skin: Skin color, texture, turgor normal. No rashes or lesions Lymph nodes: Cervical, supraclavicular, and axillary nodes normal.  Assessment & Plan:   Problem List Items Addressed This Visit    Abdominal pain    His exam is benign and his  description is very unclear and vague.  Will treat for GERD and anxiety and reassess.       Generalized anxiety disorder    Recommending trial of zoloft       Hypertension    Home readings are elevated but office readings are normal.  Continue metoprolol, bring meter to office for RN evaluation         A total of 25 minutes was spent with patient more than half of which was spent in counseling patient on the above mentioned issues , reviewing and explaining recent labs and imaging studies done, and coordination of care. I have discontinued Gilbert Cook's aspirin EC. I am also having him start on sertraline. Additionally, I am having him maintain his metoprolol succinate, pantoprazole, and multivitamin.  Meds ordered this encounter  Medications  . sertraline (ZOLOFT) 50 MG tablet    Sig: Take 1 tablet (50 mg total) by mouth daily.    Dispense:  30 tablet    Refill:  3    Medications Discontinued During This Encounter  Medication Reason  . aspirin EC 81 MG tablet Patient has not taken in last 30 days    Follow-up: Return in about 4 weeks (around 06/17/2016) for RN visit for bp comparison ,  4 wks with Gilbert Cook .   Crecencio Mc, MD

## 2016-05-23 DIAGNOSIS — I10 Essential (primary) hypertension: Secondary | ICD-10-CM | POA: Insufficient documentation

## 2016-05-23 DIAGNOSIS — R109 Unspecified abdominal pain: Secondary | ICD-10-CM | POA: Insufficient documentation

## 2016-05-23 NOTE — Assessment & Plan Note (Signed)
Home readings are elevated but office readings are normal.  Continue metoprolol, bring meter to office for RN evaluation

## 2016-05-23 NOTE — Assessment & Plan Note (Signed)
His exam is benign and his description is very unclear and vague.  Will treat for GERD and anxiety and reassess.

## 2016-05-23 NOTE — Assessment & Plan Note (Signed)
Recommending trial of zoloft

## 2016-06-11 ENCOUNTER — Other Ambulatory Visit: Payer: Self-pay | Admitting: Cardiology

## 2016-06-11 ENCOUNTER — Ambulatory Visit (INDEPENDENT_AMBULATORY_CARE_PROVIDER_SITE_OTHER): Payer: BC Managed Care – PPO

## 2016-06-11 ENCOUNTER — Other Ambulatory Visit: Payer: Self-pay

## 2016-06-11 ENCOUNTER — Ambulatory Visit (INDEPENDENT_AMBULATORY_CARE_PROVIDER_SITE_OTHER): Payer: BC Managed Care – PPO | Admitting: *Deleted

## 2016-06-11 ENCOUNTER — Ambulatory Visit: Payer: BC Managed Care – PPO

## 2016-06-11 VITALS — BP 126/86 | HR 77 | Resp 16

## 2016-06-11 DIAGNOSIS — R002 Palpitations: Secondary | ICD-10-CM

## 2016-06-11 DIAGNOSIS — I10 Essential (primary) hypertension: Secondary | ICD-10-CM

## 2016-06-11 DIAGNOSIS — R079 Chest pain, unspecified: Secondary | ICD-10-CM

## 2016-06-11 NOTE — Progress Notes (Signed)
Patient presented with home cuff for comparison to Nurse visit encounter for BP. Patient BP was taken according to nursing standards. BP in left arm 122/86 pulse 73, 8 minute wait BP taken in right arm 126/86 pulse 77. Patient placed home cuff on arm reading was 140/85 pulse 60 on left arm . Patient cuff difference systolic is 14 points difference from nurse. Checked twice . Advised patient could subtract 14 points or purchase new cuff that operates off wall power and not just batteries. Could be batteries are weak patient stated they drain easily. Patient is going to get new cuff recommended [employer] or [employer] brand.

## 2016-06-13 NOTE — Progress Notes (Signed)
  I have reviewed the above information and agree with above.   Teresa Tullo, MD 

## 2016-06-16 ENCOUNTER — Ambulatory Visit (INDEPENDENT_AMBULATORY_CARE_PROVIDER_SITE_OTHER): Payer: BC Managed Care – PPO | Admitting: Cardiology

## 2016-06-16 ENCOUNTER — Encounter: Payer: Self-pay | Admitting: Cardiology

## 2016-06-16 VITALS — BP 140/84 | HR 72 | Ht 72.0 in | Wt 193.5 lb

## 2016-06-16 DIAGNOSIS — R002 Palpitations: Secondary | ICD-10-CM | POA: Diagnosis not present

## 2016-06-16 DIAGNOSIS — E7849 Other hyperlipidemia: Secondary | ICD-10-CM

## 2016-06-16 DIAGNOSIS — E784 Other hyperlipidemia: Secondary | ICD-10-CM

## 2016-06-16 DIAGNOSIS — I10 Essential (primary) hypertension: Secondary | ICD-10-CM

## 2016-06-16 MED ORDER — ASPIRIN EC 81 MG PO TBEC: 81.0000 mg | DELAYED_RELEASE_TABLET | Freq: Every day | ORAL | 3 refills | Status: AC

## 2016-06-16 NOTE — Patient Instructions (Signed)
Medication Instructions:  Your physician has recommended you make the following change in your medication:  1. START Aspirin 81 mg once daily   Follow-Up: Your physician recommends that you schedule a follow-up appointment as needed.   It was a pleasure seeing you today here in the office. Please do not hesitate to give [place] a call back if you have any further questions. [place], BSN

## 2016-06-16 NOTE — Progress Notes (Signed)
Cardiology Office Note   Date:  06/16/2016   ID:  Gilbert Cook, DOB 1985-06-27, MRN [id number]  Referring Doctor:  Crecencio Mc, MD   Cardiologist:   Wende Bushy, MD   Reason for consultation:  Chief Complaint  Patient presents with  . OTHER    F/u echo and zio monitor. Meds reviewed verbally with pt.      History of Present Illness: Gilbert Cook is a 31 y.o. male who presents for Follow-up after testing   In terms of palpitations, patient feels that symptoms have somewhat improved since taking the Zoloft.   In terms of hypertension, they're in the process of getting a new blood pressure monitor for more accurate readings. There may have been the discrepancy between his machine at the PCPs office readings.   Patient denies chest pain and shortness of breath with exertion.   He has started to exercising more regularly, and also watching his diet more closely.   ROS:  Please see the history of present illness. Aside from mentioned under HPI, all other systems are reviewed and negative.    Past Medical History:  Diagnosis Date  . Hypercholesteremia     Past Surgical History:  Procedure Laterality Date  . CHOLECYSTECTOMY N/A 06/06/2015   Procedure: LAPAROSCOPIC CHOLECYSTECTOMY WITH INTRAOPERATIVE CHOLANGIOGRAM;  Surgeon: Gilbert Husbands, MD;  Location: [facility] ORS;  Service: General;  Laterality: N/A;  . FRACTURE SURGERY Right 2013   wrist (Screw)     reports that he has been smoking Cigarettes.  He has a 2.50 pack-year smoking history. He has never used smokeless tobacco. He reports that he drinks alcohol. He reports that he does not use drugs.   family history includes Cancer (age of onset: 57) in his father; Depression in his maternal uncle; Heart attack in his maternal grandfather; Heart disease in his maternal grandfather and mother; Hypertension in his father, maternal grandfather, maternal grandmother, mother, paternal grandfather, and paternal grandmother;  Schizophrenia in his maternal uncle.   Outpatient Medications Prior to Visit  Medication Sig Dispense Refill  . metoprolol succinate (TOPROL-XL) 25 MG 24 hr tablet Take 1 tablet (25 mg total) by mouth daily. 90 tablet 3  . Multiple Vitamin (MULTIVITAMIN) tablet Take 1 tablet by mouth daily.    . pantoprazole (PROTONIX) 40 MG tablet Take 1 tablet (40 mg total) by mouth daily. 30 tablet 3  . sertraline (ZOLOFT) 50 MG tablet Take 1 tablet (50 mg total) by mouth daily. 30 tablet 3   No facility-administered medications prior to visit.      Allergies: Patient has no known allergies.    PHYSICAL EXAM: VS:  BP 140/84 (BP Location: Left Arm, Patient Position: Sitting, Cuff Size: Normal)   Pulse 72   Ht 6' (1.829 m)   Wt 193 lb 8 oz (87.8 kg)   BMI 26.24 kg/m  , Body mass index is 26.24 kg/m. Wt Readings from Last 3 Encounters:  06/16/16 193 lb 8 oz (87.8 kg)  05/20/16 193 lb 9.6 oz (87.8 kg)  05/07/16 194 lb (88 kg)    GENERAL:  well developed, well nourished, obese, not in acute distress HEENT: normocephalic, pink conjunctivae, anicteric sclerae, no xanthelasma, normal dentition, oropharynx clear NECK:  no neck vein engorgement, JVP normal, no hepatojugular reflux, carotid upstroke brisk and symmetric, no bruit, no thyromegaly, no lymphadenopathy LUNGS:  good respiratory effort, clear to auscultation bilaterally CV:  PMI not displaced, no thrills, no lifts, S1 and S2 within normal limits,  no palpable S3 or S4, no murmurs, no rubs, no gallops ABD:  Soft, nontender, nondistended, normoactive bowel sounds, no abdominal aortic bruit, no hepatomegaly, no splenomegaly MS: nontender back, no kyphosis, no scoliosis, no joint deformities EXT:  2+ DP/PT pulses, no edema, no varicosities, no cyanosis, no clubbing SKIN: warm, nondiaphoretic, normal turgor, no ulcers NEUROPSYCH: alert, oriented to person, place, and time, sensory/motor grossly intact, normal mood, appropriate affect   Recent  Labs: 04/23/2016: Hemoglobin 15.8; Platelets 224 04/27/2016: ALT 23; BUN 13; Creatinine, Ser 1.02; Potassium 4.1; Sodium 140; TSH 3.45   Lipid Panel    Component Value Date/Time   CHOL 262 (H) 04/27/2016 1709   TRIG 200.0 (H) 04/27/2016 1709   HDL 50.50 04/27/2016 1709   CHOLHDL 5 04/27/2016 1709   VLDL 40.0 04/27/2016 1709   LDLCALC 171 (H) 04/27/2016 1709   LDLDIRECT 197.3 12/14/2012 1642     Other studies Reviewed:  EKG:  The ekg from 05/07/2016 was personally reviewed by me and it revealed sinus rhythm, 66 BPM.  Additional studies/ records that were reviewed personally reviewed by me today include:   Echo 06/11/2016: Left ventricle: The cavity size was normal. Wall thickness was   normal. Systolic function was normal. The estimated ejection   fraction was in the range of 55% to 60%. Wall motion was normal;   there were no regional wall motion abnormalities. Left   ventricular diastolic function parameters were normal.  renal artery u/s 06/11/2016: unremarkable  Monitor 05/20/2016: Overall rhythm was sinus. The heart rate ranged from 40-163 BPM, average of 70 BPM.  No evidence of ventricular tachycardia, pauses, AV block, atrial fibrillation, supraventricular tachycardia.  15 patient triggered events/diary entries. These corresponded to sinus rhythm, and rare PVCs.  No high grade supraventricular ectopy: Isolated PACs and atrial couplets were rare.  No high-grade ventricular ectopy: Isolated PVCs were rare.   ASSESSMENT AND PLAN: Palpitations Echo showed preserved EF. No significant arrhythmia on the monitor. Patient reassured.  Hypertension Patient mentions that significant history of hypertension the family. May be began at a young age. Recommend workup for secondary causes of hypertension: PA-C/PRA - wnl Plasma metanephrines - wnl Renal artery ultrasound - unremarkable PCP following blood pressure management. Currently blood pressure is well controlled. In  the future, may utilize amlodipine if needed.  Hyperlipidemia LDL from febrile 2018 was 171. We discussed at length that with his family history, ideal LDL goal would be less than 100 to be aggressive with prevention. Patient will continue to follow up with PCP for this and may need a repeat blood work 3 months after initiating lifestyle and dietary changes. We'll defer to PCP due to patient preference.  Current medicines are reviewed at length with the patient today.  The patient does not have concerns regarding medicines.  Labs/ tests ordered today include:  No orders of the defined types were placed in this encounter.   I had a lengthy and detailed discussion with the patient regarding diagnoses, prognosis, diagnostic options.   Disposition:   FU with cardiology prn  I spent at least 25 minutes with the patient today and more than 50% of the time was spent counseling the patient and coordinating care.     Signed, Wende Bushy, MD  06/16/2016 5:38 PM    Gilbert Cook  This note was generated in part with voice recognition software and I apologize for any typographical errors that were not detected and corrected.

## 2016-06-22 ENCOUNTER — Ambulatory Visit (INDEPENDENT_AMBULATORY_CARE_PROVIDER_SITE_OTHER): Payer: BC Managed Care – PPO | Admitting: Internal Medicine

## 2016-06-22 ENCOUNTER — Encounter: Payer: Self-pay | Admitting: Internal Medicine

## 2016-06-22 VITALS — BP 118/80 | HR 63 | Temp 98.3°F | Resp 16 | Ht 72.0 in | Wt 195.2 lb

## 2016-06-22 DIAGNOSIS — R0789 Other chest pain: Secondary | ICD-10-CM

## 2016-06-22 DIAGNOSIS — F411 Generalized anxiety disorder: Secondary | ICD-10-CM | POA: Diagnosis not present

## 2016-06-22 DIAGNOSIS — E7800 Pure hypercholesterolemia, unspecified: Secondary | ICD-10-CM

## 2016-06-22 DIAGNOSIS — I10 Essential (primary) hypertension: Secondary | ICD-10-CM | POA: Diagnosis not present

## 2016-06-22 DIAGNOSIS — R1010 Upper abdominal pain, unspecified: Secondary | ICD-10-CM | POA: Diagnosis not present

## 2016-06-22 DIAGNOSIS — E784 Other hyperlipidemia: Secondary | ICD-10-CM

## 2016-06-22 DIAGNOSIS — E7849 Other hyperlipidemia: Secondary | ICD-10-CM

## 2016-06-22 MED ORDER — PANTOPRAZOLE SODIUM 40 MG PO TBEC: 40.0000 mg | DELAYED_RELEASE_TABLET | Freq: Every day | ORAL | 1 refills | Status: DC

## 2016-06-22 MED ORDER — SERTRALINE HCL 50 MG PO TABS: 50.0000 mg | ORAL_TABLET | Freq: Every day | ORAL | 2 refills | Status: DC

## 2016-06-22 NOTE — Patient Instructions (Addendum)
Try taking Beano with any gas producing vegetables .  If you still have a problem afterward, you can use Gas X or phaszyme (OTC)   Repeat fasting lipids in about 6 weeks , if still high we'll start the Trial of red yeast rice 600 mg twice daily  For cholesterol   Agree with a baby aspirin daily if it doesn't bother your stomach

## 2016-06-22 NOTE — Progress Notes (Signed)
Pre visit review using our clinic review tool, if applicable. No additional management support is needed unless otherwise documented below in the visit note. 

## 2016-06-22 NOTE — Progress Notes (Signed)
Subjective:  Patient ID: Gilbert Cook, male    DOB: 1985-08-29  Age: 31 y.o. MRN: [id number]  CC: The primary encounter diagnosis was Pure hypercholesterolemia. Diagnoses of Essential hypertension, Other hyperlipidemia, Generalized anxiety disorder, Chest wall pain, and Pain of upper abdomen were also pertinent to this visit.  HPI BRINTON BRANDEL presents for follow up on generalized anxiety, hypertension, chronic abdominal pain  Last seen   In late March.  Vague abdominal  symptoms voice at that time in the setting of uncontrolled anxiety.  Exam normal.  GERD meds prescribed (protonix)   GAD: sertraline started .  Feels significantly better   BP improved.  After seeing cardiology, he is concerned about his cholesterol.    Lipids reviewed: LDL 162 ,  No early FH    Lab Results  Component Value Date   CREATININE 1.02 04/27/2016     Outpatient Medications Prior to Visit  Medication Sig Dispense Refill  . metoprolol succinate (TOPROL-XL) 25 MG 24 hr tablet Take 1 tablet (25 mg total) by mouth daily. 90 tablet 3  . Multiple Vitamin (MULTIVITAMIN) tablet Take 1 tablet by mouth daily.    . pantoprazole (PROTONIX) 40 MG tablet Take 1 tablet (40 mg total) by mouth daily. 30 tablet 3  . sertraline (ZOLOFT) 50 MG tablet Take 1 tablet (50 mg total) by mouth daily. 30 tablet 3  . aspirin EC 81 MG tablet Take 1 tablet (81 mg total) by mouth daily. (Patient not taking: Reported on 06/22/2016) 90 tablet 3   No facility-administered medications prior to visit.     Review of Systems;  Patient denies headache, fevers, malaise, unintentional weight loss, skin rash, eye pain, sinus congestion and sinus pain, sore throat, dysphagia,  hemoptysis , cough, dyspnea, wheezing, chest pain, palpitations, orthopnea, edema, abdominal pain, nausea, melena, diarrhea, constipation, flank pain, dysuria, hematuria, urinary  Frequency, nocturia, numbness, tingling, seizures,  Focal weakness, Loss of  consciousness,  Tremor, insomnia, depression, anxiety, and suicidal ideation.      Objective:  BP 118/80   Pulse 63   Temp 98.3 F (36.8 C) (Oral)   Resp 16   Ht 6' (1.829 m)   Wt 195 lb 3.2 oz (88.5 kg)   SpO2 96%   BMI 26.47 kg/m   BP Readings from Last 3 Encounters:  06/22/16 118/80  06/16/16 140/84  06/11/16 126/86    Wt Readings from Last 3 Encounters:  06/22/16 195 lb 3.2 oz (88.5 kg)  06/16/16 193 lb 8 oz (87.8 kg)  05/20/16 193 lb 9.6 oz (87.8 kg)    General appearance: alert, cooperative and appears stated age Ears: normal TM's and external ear canals both ears Throat: lips, mucosa, and tongue normal; teeth and gums normal Neck: no adenopathy, no carotid bruit, supple, symmetrical, trachea midline and thyroid not enlarged, symmetric, no tenderness/mass/nodules Back: symmetric, no curvature. ROM normal. No CVA tenderness. Lungs: clear to auscultation bilaterally Heart: regular rate and rhythm, S1, S2 normal, no murmur, click, rub or gallop Abdomen: soft, non-tender; bowel sounds normal; no masses,  no organomegaly Pulses: 2+ and symmetric Skin: Skin color, texture, turgor normal. No rashes or lesions Lymph nodes: Cervical, supraclavicular, and axillary nodes normal.  No results found for: HGBA1C  Lab Results  Component Value Date   CREATININE 1.02 04/27/2016   CREATININE 1.22 04/23/2016   CREATININE 1.02 06/04/2015    Lab Results  Component Value Date   WBC 9.3 04/23/2016   HGB 15.8 04/23/2016   HCT 46.8 04/23/2016  PLT 224 04/23/2016   GLUCOSE 81 04/27/2016   CHOL 262 (H) 04/27/2016   TRIG 200.0 (H) 04/27/2016   HDL 50.50 04/27/2016   LDLDIRECT 197.3 12/14/2012   LDLCALC 171 (H) 04/27/2016   ALT 23 04/27/2016   AST 18 04/27/2016   NA 140 04/27/2016   K 4.1 04/27/2016   CL 103 04/27/2016   CREATININE 1.02 04/27/2016   BUN 13 04/27/2016   CO2 29 04/27/2016   TSH 3.45 04/27/2016    No results found.  Assessment & Plan:   Problem List  Items Addressed This Visit    Abdominal pain    Improved.  symptomatic control of indigestion and bloating advised.       Chest wall pain    Cardiology evaluation done, noninvasive.  No cardiomyopathy or WMA.       Generalized anxiety disorder    Mood greatly improved,  No changes today.       Hypertension    Well controlled on current regimen. Renal function stable, no changes today.  Lab Results  Component Value Date   CREATININE 1.02 04/27/2016   Lab Results  Component Value Date   NA 140 04/27/2016   K 4.1 04/27/2016   CL 103 04/27/2016   CO2 29 04/27/2016         Other hyperlipidemia    Given his age and lack of FH , recommended repeating fasting lipids in a few months after giving dietary changes a chance to improve his panel.        Other Visit Diagnoses    Pure hypercholesterolemia    -  Primary   Relevant Orders   Lipid panel      I am having Mr. Odenthal maintain his metoprolol succinate, multivitamin, aspirin EC, sertraline, and pantoprazole.  Meds ordered this encounter  Medications  . sertraline (ZOLOFT) 50 MG tablet    Sig: Take 1 tablet (50 mg total) by mouth daily.    Dispense:  90 tablet    Refill:  2  . pantoprazole (PROTONIX) 40 MG tablet    Sig: Take 1 tablet (40 mg total) by mouth daily.    Dispense:  90 tablet    Refill:  1   A total of 40 minutes was spent with patient more than half of which was spent in counseling patient on the above mentioned issues , reviewing and explaining recent labs and imaging studies done, and coordination of care. Medications Discontinued During This Encounter  Medication Reason  . sertraline (ZOLOFT) 50 MG tablet Reorder  . pantoprazole (PROTONIX) 40 MG tablet Reorder    Follow-up: Return in about 6 months (around 12/22/2016) for fasting labs 6 weeks .   Crecencio Mc, MD

## 2016-06-23 NOTE — Assessment & Plan Note (Signed)
Mood greatly improved,  No changes today.

## 2016-06-23 NOTE — Assessment & Plan Note (Signed)
Improved.  symptomatic control of indigestion and bloating advised.

## 2016-06-23 NOTE — Assessment & Plan Note (Signed)
Cardiology evaluation done, noninvasive.  No cardiomyopathy or WMA.

## 2016-06-23 NOTE — Assessment & Plan Note (Signed)
Well controlled on current regimen. Renal function stable, no changes today.  Lab Results  Component Value Date   CREATININE 1.02 04/27/2016   Lab Results  Component Value Date   NA 140 04/27/2016   K 4.1 04/27/2016   CL 103 04/27/2016   CO2 29 04/27/2016

## 2016-06-23 NOTE — Assessment & Plan Note (Signed)
Given his age and lack of FH , recommended repeating fasting lipids in a few months after giving dietary changes a chance to improve his panel.

## 2016-12-22 ENCOUNTER — Encounter: Payer: Self-pay | Admitting: Internal Medicine

## 2016-12-22 ENCOUNTER — Ambulatory Visit (INDEPENDENT_AMBULATORY_CARE_PROVIDER_SITE_OTHER): Payer: BC Managed Care – PPO | Admitting: Internal Medicine

## 2016-12-22 VITALS — BP 148/92 | HR 63 | Temp 97.9°F | Resp 16 | Ht 72.0 in | Wt 196.4 lb

## 2016-12-22 DIAGNOSIS — E782 Mixed hyperlipidemia: Secondary | ICD-10-CM

## 2016-12-22 DIAGNOSIS — R1010 Upper abdominal pain, unspecified: Secondary | ICD-10-CM

## 2016-12-22 DIAGNOSIS — F411 Generalized anxiety disorder: Secondary | ICD-10-CM | POA: Diagnosis not present

## 2016-12-22 DIAGNOSIS — I10 Essential (primary) hypertension: Secondary | ICD-10-CM | POA: Diagnosis not present

## 2016-12-22 DIAGNOSIS — Z23 Encounter for immunization: Secondary | ICD-10-CM

## 2016-12-22 MED ORDER — SERTRALINE HCL 50 MG PO TABS: 75.0000 mg | ORAL_TABLET | Freq: Every day | ORAL | 2 refills | Status: DC

## 2016-12-22 NOTE — Progress Notes (Signed)
Subjective:  Patient ID: Gilbert Cook, male    DOB: May 11, 1985  Age: 31 y.o. MRN: [id number]  CC: The primary encounter diagnosis was Essential hypertension. Diagnoses of Need for immunization against influenza, Mixed hyperlipidemia, Generalized anxiety disorder, and Pain of upper abdomen were also pertinent to this visit.  HPI [name] presents for follow up on multiple issues.  Last seen 6 months ago.  hypertension:  Patient is taking his medications as prescribed and notes no adverse effects.  Home BP readings have not been lately .  He is  avoiding added salt in her diet and working out  At a gym regularly about 3 times per week for exercise.Marland Kitchen  Has a home machine  Anxiety: sertraline helping , continue to have occasional episodes of feeling overwhemed , but denies symptoms of full fledged panic attack. ocurring daily  Discussed increasing dose of zoloft  Abd pain: resolved initially with protonix for several months but for the last 2  Or 3 months  has been having a return symptoms of epigastric pain.  Worse when he lies down  At night,  And worse in the morning If eats before he goes to bed.   triggers include coffee and higher fat diet .  Having loose stools once or twice per week after fatty foods. No cramping,  No blood in stools, occasionally when  He wipes.    He has had a cholecystectomy       Outpatient Medications Prior to Visit  Medication Sig Dispense Refill  . aspirin EC 81 MG tablet Take 1 tablet (81 mg total) by mouth daily. 90 tablet 3  . metoprolol succinate (TOPROL-XL) 25 MG 24 hr tablet Take 1 tablet (25 mg total) by mouth daily. 90 tablet 3  . Multiple Vitamin (MULTIVITAMIN) tablet Take 1 tablet by mouth daily.    . pantoprazole (PROTONIX) 40 MG tablet Take 1 tablet (40 mg total) by mouth daily. 90 tablet 1  . sertraline (ZOLOFT) 50 MG tablet Take 1 tablet (50 mg total) by mouth daily. 90 tablet 2   No facility-administered medications prior to visit.      Review of Systems;  Patient denies headache, fevers, malaise, unintentional weight loss, skin rash, eye pain, sinus congestion and sinus pain, sore throat, dysphagia,  hemoptysis , cough, dyspnea, wheezing, chest pain, palpitations, orthopnea, edema, abdominal pain, nausea, melena, diarrhea, constipation, flank pain, dysuria, hematuria, urinary  Frequency, nocturia, numbness, tingling, seizures,  Focal weakness, Loss of consciousness,  Tremor, insomnia, depression, anxiety, and suicidal ideation.      Objective:  BP (!) 148/92 (BP Location: Left Arm, Patient Position: Sitting, Cuff Size: Normal)   Pulse 63   Temp 97.9 F (36.6 C) (Oral)   Resp 16   Ht 6' (1.829 m)   Wt 196 lb 6.4 oz (89.1 kg)   SpO2 96%   BMI 26.64 kg/m   BP Readings from Last 3 Encounters:  12/22/16 (!) 148/92  06/22/16 118/80  06/16/16 140/84    Wt Readings from Last 3 Encounters:  12/22/16 196 lb 6.4 oz (89.1 kg)  06/22/16 195 lb 3.2 oz (88.5 kg)  06/16/16 193 lb 8 oz (87.8 kg)    General appearance: alert, cooperative and appears stated age Ears: normal TM's and external ear canals both ears Throat: lips, mucosa, and tongue normal; teeth and gums normal Neck: no adenopathy, no carotid bruit, supple, symmetrical, trachea midline and thyroid not enlarged, symmetric, no tenderness/mass/nodules Back: symmetric, no curvature. ROM normal. No  CVA tenderness. Lungs: clear to auscultation bilaterally Heart: regular rate and rhythm, S1, S2 normal, no murmur, click, rub or gallop Abdomen: soft, non-tender; bowel sounds normal; no masses,  no organomegaly Pulses: 2+ and symmetric Skin: Skin color, texture, turgor normal. No rashes or lesions Lymph nodes: Cervical, supraclavicular, and axillary nodes normal. Psych: affect flat.  Avoids direct eye contact. Looks to his wife/partner for assistance in answering questions. No fidgeting,    Denies suicidal thoughts   No results found for: HGBA1C  Lab Results   Component Value Date   CREATININE 1.02 04/27/2016   CREATININE 1.22 04/23/2016   CREATININE 1.02 06/04/2015    Lab Results  Component Value Date   WBC 9.3 04/23/2016   HGB 15.8 04/23/2016   HCT 46.8 04/23/2016   PLT 224 04/23/2016   GLUCOSE 81 04/27/2016   CHOL 262 (H) 04/27/2016   TRIG 200.0 (H) 04/27/2016   HDL 50.50 04/27/2016   LDLDIRECT 197.3 12/14/2012   LDLCALC 171 (H) 04/27/2016   ALT 23 04/27/2016   AST 18 04/27/2016   NA 140 04/27/2016   K 4.1 04/27/2016   CL 103 04/27/2016   CREATININE 1.02 04/27/2016   BUN 13 04/27/2016   CO2 29 04/27/2016   TSH 3.45 04/27/2016    No results found.  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  Add evenign famotidine  Refer to GI for EGD       Generalized anxiety disorder    Improved but not at goal.  Increase sertaline to 75 mg daily;  Request for alprazolam denied given lack of panic attacks       Relevant Medications   sertraline (ZOLOFT) 50 MG tablet   Hypertension - Primary    he reports compliance with medication regimen  but has an elevated reading today in office.  He has been asked to check his BP at work and  submit readings for evaluation. Renal function will be checked soon  Lab Results  Component Value Date   CREATININE 1.02 04/27/2016   Lab Results  Component Value Date   NA 140 04/27/2016   K 4.1 04/27/2016   CL 103 04/27/2016   CO2 29 04/27/2016         Relevant Orders   Comprehensive metabolic panel    Other Visit Diagnoses    Need for immunization against influenza       Relevant Orders   Flu Vaccine QUAD 36+ mos IM (Completed)   Mixed hyperlipidemia       Relevant Orders   Lipid panel      I have changed Mr. Sze's sertraline. I am also having him maintain his metoprolol succinate, multivitamin, aspirin EC, and pantoprazole.  Meds  ordered this encounter  Medications  . sertraline (ZOLOFT) 50 MG tablet    Sig: Take 1.5 tablets (75 mg total) by mouth daily.    Dispense:  135 tablet    Refill:  2    Medications Discontinued During This Encounter  Medication Reason  . sertraline (ZOLOFT) 50 MG tablet Reorder    Follow-up: Return in about 4 weeks (around 01/19/2017).   Crecencio Mc, MD

## 2016-12-22 NOTE — Progress Notes (Signed)
Review of Systems  Objective:  BP (!) 148/92 (BP Location: Left Arm, Patient Position: Sitting, Cuff Size: Normal)   Pulse 63   Temp 97.9 F (36.6 C) (Oral)   Resp 16   Ht 6' (1.829 m)   Wt 196 lb 6.4 oz (89.1 kg)   SpO2 96%   BMI 26.64 kg/m   Physical Exam

## 2016-12-22 NOTE — Patient Instructions (Addendum)
For treatment of evening symptoms .  Try any of of the following   Famotidine  20 mg   mylanta 20 ML  ( whatever the rec dose on the bottle) or Mylanta GAs  Gaviscon  (liquid)    GI REFERRAL TO Dr. Allen Norris     your blood pressure is  above goal . Please check readings a few times over the next 4 weeks and bring readings and machine to yoru next visit    Increase your sertraline to 1.5 tablets daily

## 2016-12-24 NOTE — Assessment & Plan Note (Signed)
he reports compliance with medication regimen  but has an elevated reading today in office.  He has been asked to check his BP at work and  submit readings for evaluation. Renal function will be checked soon  Lab Results  Component Value Date   CREATININE 1.02 04/27/2016   Lab Results  Component Value Date   NA 140 04/27/2016   K 4.1 04/27/2016   CL 103 04/27/2016   CO2 29 04/27/2016

## 2016-12-24 NOTE — Assessment & Plan Note (Signed)
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  Add evenign famotidine  Refer to GI for EGD

## 2016-12-24 NOTE — Assessment & Plan Note (Signed)
Improved but not at goal.  Increase sertaline to 75 mg daily;  Request for alprazolam denied given lack of panic attacks

## 2017-01-08 ENCOUNTER — Telehealth: Payer: Self-pay | Admitting: Internal Medicine

## 2017-01-08 DIAGNOSIS — R1010 Upper abdominal pain, unspecified: Secondary | ICD-10-CM

## 2017-01-08 NOTE — Telephone Encounter (Signed)
My apologies..  The referral has been made for GI Dr Allen Norris

## 2017-01-08 NOTE — Telephone Encounter (Signed)
Patients wife called the [facility] and complained (wanted to speak to manager) because Gilbert Cook was suppose to of received a call earlier this week about an appointment for EGD. No referral has been entered for this. Can you enter it please? Thank you!

## 2017-01-13 NOTE — Telephone Encounter (Signed)
Pt was give Dr. Lupita Dawn apologies and told that they would be getting a call from Dr. Dorothey Baseman office to schedule the EGD.

## 2017-01-18 ENCOUNTER — Ambulatory Visit: Payer: BC Managed Care – PPO | Admitting: Gastroenterology

## 2017-01-19 ENCOUNTER — Encounter: Payer: Self-pay | Admitting: Gastroenterology

## 2017-01-19 ENCOUNTER — Ambulatory Visit: Payer: BC Managed Care – PPO | Admitting: Gastroenterology

## 2017-01-19 VITALS — BP 135/78 | HR 65 | Temp 98.3°F | Ht 72.0 in | Wt 198.2 lb

## 2017-01-19 DIAGNOSIS — K219 Gastro-esophageal reflux disease without esophagitis: Secondary | ICD-10-CM

## 2017-01-19 DIAGNOSIS — R1013 Epigastric pain: Secondary | ICD-10-CM | POA: Diagnosis not present

## 2017-01-19 NOTE — Progress Notes (Signed)
Gilbert Antigua, MD [address], [place], [place], [place], [address] [address], [place], [place], [place], [address] Phone: [phone]  Fax: [phone]  Consultation  Referring Provider:     Crecencio Mc, MD Primary Care Physician:  Crecencio Mc, MD Primary Gastroenterologist:  Virgel Manifold, MD        Reason for Consultation:    Epigastric pain  Date of Consultation:  01/19/2017         HPI:   Gilbert Cook is a 31 y.o. male with midepigastric pain.  It started 2-3 months ago, associated with heartburn, occurs more often at night, not associated with nausea vomiting, cramping in quality, better with Protonix.  He was started on Protonix 40 mg every day that he takes in the morning.  However he eats before taking the medication.  He avoids eating 2 hours before bedtime, hot liquids, fatty foods, coffee and beer exacerbate his symptoms.  No weight loss, no altered bowel habits, no blood in stool.  Cousin with colon cancer but no immediate family member with colon cancer.  Has had history of cholecystectomy 2-3 years ago.  Past Medical History:  Diagnosis Date  . Hypercholesteremia     Past Surgical History:  Procedure Laterality Date  . FRACTURE SURGERY Right 2013   wrist (Screw)  . LAPAROSCOPIC CHOLECYSTECTOMY WITH INTRAOPERATIVE CHOLANGIOGRAM N/A 06/06/2015   Performed by Jules Husbands, MD at [facility] ORS    Prior to Admission medications   Medication Sig Start Date End Date Taking? Authorizing Provider  aspirin EC 81 MG tablet Take 1 tablet (81 mg total) by mouth daily. 06/16/16  Yes Wende Bushy, MD  metoprolol succinate (TOPROL-XL) 25 MG 24 hr tablet Take 1 tablet (25 mg total) by mouth daily. 04/27/16  Yes Crecencio Mc, MD  Multiple Vitamin (MULTIVITAMIN) tablet Take 1 tablet by mouth daily.   Yes [provider]  pantoprazole (PROTONIX) 40 MG tablet Take 1 tablet (40 mg total) by mouth daily. 06/22/16  Yes Crecencio Mc, MD    sertraline (ZOLOFT) 50 MG tablet Take 1.5 tablets (75 mg total) by mouth daily. 12/22/16  Yes Crecencio Mc, MD    Family History  Problem Relation Age of Onset  . Hypertension Mother   . Heart disease Mother   . Hypertension Father   . Cancer Father 57       T cell leukemia  . Hypertension Maternal Grandmother   . Hypertension Maternal Grandfather   . Heart disease Maternal Grandfather   . Heart attack Maternal Grandfather   . Hypertension Paternal Grandmother   . Hypertension Paternal Grandfather   . Depression Maternal Uncle   . Schizophrenia Maternal Uncle      Social History   Tobacco Use  . Smoking status: Former Smoker    Packs/day: 0.50    Years: 5.00    Pack years: 2.50    Types: Cigarettes    Last attempt to quit: 12/15/2010    Years since quitting: 6.1  . Smokeless tobacco: Never Used  Substance Use Topics  . Alcohol use: Yes  . Drug use: Yes    Types: Marijuana    Allergies as of 01/19/2017  . (No Known Allergies)    Review of Systems:    All systems reviewed and negative except where noted in HPI.   Physical Exam:  Vital signs in last 24 hours: @VSRANGES @   General:   Pleasant, cooperative in NAD Head:  Normocephalic and  atraumatic. Eyes:   No icterus.   Conjunctiva pink. PERRLA. Ears:  Normal auditory acuity. Neck:  Supple; no masses or thyroidomegaly Lungs: Respirations even and unlabored. Lungs clear to auscultation bilaterally.   No wheezes, crackles, or rhonchi.  Heart:  Regular rate and rhythm;  Without murmur, clicks, rubs or gallops Abdomen:  Soft, nondistended, nontender. Normal bowel sounds. No appreciable masses or hepatomegaly.  No rebound or guarding.  Neurologic:  Alert and oriented x3;  grossly normal neurologically. Skin:  Intact without significant lesions or rashes. Cervical Nodes:  No significant cervical adenopathy. Psych:  Alert and cooperative. Normal affect.  LAB RESULTS: No results for input(s): WBC, HGB, HCT, PLT  in the last 72 hours. BMET No results for input(s): NA, K, CL, CO2, GLUCOSE, BUN, CREATININE, CALCIUM in the last 72 hours. LFT No results for input(s): PROT, ALBUMIN, AST, ALT, ALKPHOS, BILITOT, BILIDIR, IBILI in the last 72 hours. PT/INR No results for input(s): LABPROT, INR in the last 72 hours. February 2018 labs reviewed, normal CMP, normal CBC STUDIES: No results found.    Impression / Plan:   Gilbert Cook is a 31 y.o. y/o male with midepigastric abdominal pain, with positive acid reflux symptoms, improved with Protonix  Patient has classic symptoms of GERD Educated on proper way to take Protonix, 30 minutes before breakfast in the morning. Risks of PPI use were discussed with patient including bone loss, C. Diff diarrhea, pneumonia, infections, CKD, electrolyte abnormalities. Pt. Verbalizes understanding and chooses to continue the medication.   We will add Zantac at bedtime as his symptoms are more often at night. Patient has to get up at which to use daily Patient asked to not eat 3 hours before bedtime Patient educated on diet modifications to help with reflux.  He verbalizes understanding. No alarm symptoms present to indicate EGD at this time His symptoms remain uncontrolled can consider EGD at that time Goal would be to institute lifestyle modifications and decrease Protonix dose or discontinue it long-term if symptoms resolve.  Patient agreeable with the plan  Thank you for involving me in the care of this patient.     Virgel Manifold, MD  01/19/2017, 9:14 AM

## 2017-01-19 NOTE — Patient Instructions (Signed)
Please take Zantac (Ranitidine)150mg  at bedtime. You can purchase over the counter.  Use a wedge pillow.   Lab order has been placed for H pylori antibody. Please go to any [facility] draw station.

## 2017-01-26 ENCOUNTER — Telehealth: Payer: Self-pay | Admitting: Gastroenterology

## 2017-01-26 ENCOUNTER — Other Ambulatory Visit: Payer: Self-pay

## 2017-01-26 ENCOUNTER — Other Ambulatory Visit: Payer: Self-pay | Admitting: Gastroenterology

## 2017-01-26 NOTE — Telephone Encounter (Signed)
*  STAT* If patient is at the pharmacy, call can be transferred to refill team.   1. Which medications need to be refilled? (please list name of each medication and dose if known) Generic Zantac   2. Which pharmacy/location (including street and city if local pharmacy) is medication to be sent to? [facility]  3. Do they need a 30 day or 90 day supply? 90 day

## 2017-01-26 NOTE — Telephone Encounter (Signed)
Patient would like a RX for a bed wedge pillow Medical supply that's across the street. Please call patient

## 2017-01-26 NOTE — Telephone Encounter (Signed)
LVM for spouse to callback and clarify supply store and type of Rx.

## 2017-01-27 ENCOUNTER — Other Ambulatory Visit: Payer: Self-pay

## 2017-01-27 DIAGNOSIS — K219 Gastro-esophageal reflux disease without esophagitis: Secondary | ICD-10-CM

## 2017-01-27 MED ORDER — MISC. DEVICES MISC: 0 refills | Status: AC

## 2017-01-27 MED ORDER — RANITIDINE HCL 150 MG PO TABS: 150.0000 mg | ORAL_TABLET | Freq: Every day | ORAL | 2 refills | Status: DC

## 2017-01-27 NOTE — Telephone Encounter (Signed)
Rx for Ranitidine 150mg  sent to [facility] per pt request.

## 2017-01-27 NOTE — Progress Notes (Signed)
Response to the following:  Patients wife left a voice message that the wedge pillow needs to go to [facility] at [place] and they are also waiting on a RX to be called into [facility] on [address].  Advised pt that Ranitidine was sent to [facility] on 11/26.  Sent wedge pillow order to [facility].

## 2017-01-27 NOTE — Telephone Encounter (Signed)
Patients wife left a voice message that the wedge pillow needs to go to [facility] at [place] and they are also waiting on a RX to be called into [facility] on [address]

## 2017-01-29 ENCOUNTER — Other Ambulatory Visit: Payer: BC Managed Care – PPO

## 2017-02-16 ENCOUNTER — Ambulatory Visit: Payer: BC Managed Care – PPO | Admitting: Internal Medicine

## 2017-02-16 LAB — H. PYLORI ANTIBODY, IGG

## 2017-02-19 ENCOUNTER — Encounter: Payer: Self-pay | Admitting: Gastroenterology

## 2017-02-24 ENCOUNTER — Other Ambulatory Visit: Payer: Self-pay | Admitting: Internal Medicine

## 2017-02-24 NOTE — Telephone Encounter (Signed)
Pt states that he took his last one today.  Adv of med refill time frame 48-72 business hours.

## 2017-02-24 NOTE — Telephone Encounter (Signed)
Please advise 

## 2017-02-25 ENCOUNTER — Ambulatory Visit: Payer: BC Managed Care – PPO

## 2017-03-24 ENCOUNTER — Ambulatory Visit: Payer: BC Managed Care – PPO | Admitting: Internal Medicine

## 2017-03-24 DIAGNOSIS — Z0289 Encounter for other administrative examinations: Secondary | ICD-10-CM

## 2017-05-06 ENCOUNTER — Other Ambulatory Visit: Payer: Self-pay | Admitting: Internal Medicine

## 2017-08-30 ENCOUNTER — Telehealth: Payer: Self-pay

## 2017-08-30 NOTE — Telephone Encounter (Signed)
Copied from [place] [phone]. Topic: Bill or Statement - Patient/Guarantor Inquiry >> Aug 30, 2017  4:54 PM Selinda Flavin B, NT wrote: Patient's wife calling and states that they are still receiving a bill for $50 for cancelling the 03/24/17 appointment. Patient states that they were told that they would not be charged and that the appointment could be filled. Per Appointment note also. Please advise.  CB#: [phone]

## 2017-08-31 ENCOUNTER — Telehealth: Payer: Self-pay

## 2017-08-31 NOTE — Telephone Encounter (Signed)
Copied from [place] [phone]. Topic: Bill or Statement - Patient/Guarantor Inquiry >> Aug 30, 2017  4:54 PM Selinda Flavin B, NT wrote: Patient's wife calling and states that they are still receiving a bill for $50 for cancelling the 03/24/17 appointment. Patient states that they were told that they would not be charged and that the appointment could be filled. Per Appointment note also. Please advise.  CB#: [phone]

## 2017-08-31 NOTE — Telephone Encounter (Signed)
Patient's information has been sent to charge correction to have the fee removed.

## 2017-09-07 ENCOUNTER — Telehealth: Payer: Self-pay

## 2017-09-07 NOTE — Telephone Encounter (Signed)
The removal of the no show fee was completed on 7.5.19.

## 2017-09-07 NOTE — Telephone Encounter (Signed)
Copied from [place] [phone]. Topic: Bill or Statement - Patient/Guarantor Inquiry >> Aug 30, 2017  4:54 PM Selinda Flavin B, NT wrote: Patient's wife calling and states that they are still receiving a bill for $50 for cancelling the 03/24/17 appointment. Patient states that they were told that they would not be charged and that the appointment could be filled. Per Appointment note also. Please advise.  CB#: [phone] >> Sep 06, 2017  8:45 AM Jerene Dilling H wrote: Centralized coding is unable to remove NS fees. Please forward message to the office.   Thank you,

## 2017-09-26 ENCOUNTER — Other Ambulatory Visit: Payer: Self-pay | Admitting: Internal Medicine

## 2017-10-22 ENCOUNTER — Other Ambulatory Visit: Payer: Self-pay | Admitting: Gastroenterology

## 2017-11-25 ENCOUNTER — Ambulatory Visit: Payer: Self-pay

## 2017-11-25 NOTE — Telephone Encounter (Signed)
Pt wife called in to report her husband is having abdominal pain after stopping zantac. Call was placed to her husband at her request. Mr Eberwein reports that he stopped taking zantac because there was a recall.He stopped it on Saturday 9/21. Pt is still taking protonix as noted on his medication list. He states the pain is between mild and moderate He states that his abdomin is tender to touch both sides but unsure if it is swollen. He states he has GERD and has notice some acid reflux. Tums reduces the gas. His symptoms were sudden and the pain comes and goes. He has no nausea. Appointment made per protocol. Care advice read to patient. Pt verbalized understanding of all instructions.   Reason for Disposition . [1] MILD pain (e.g., does not interfere with normal activities) AND [2] pain comes and goes (cramps) [3] present > 48 hours    Pain comes and goes is between mild and moderate and present since Saturday 11/20/17  Answer Assessment - Initial Assessment Questions 1. LOCATION: "Where does it hurt?"      abdomine 2. RADIATION: "Does the pain shoot anywhere else?" (e.g., chest, back)     no 3. ONSET: "When did the pain begin?" (Minutes, hours or days ago)      saturday 4. SUDDEN: "Gradual or sudden onset?"     sudden 5. PATTERN "Does the pain come and go, or is it constant?"    - If constant: "Is it getting better, staying the same, or worsening?"      (Note: Constant means the pain never goes away completely; most serious pain is constant and it progresses)     - If intermittent: "How long does it last?" "Do you have pain now?"     (Note: Intermittent means the pain goes away completely between bouts)     Comes and go 6. SEVERITY: "How bad is the pain?"  (e.g., Scale 1-10; mild, moderate, or severe)    - MILD (1-3): doesn't interfere with normal activities, abdomen soft and not tender to touch     - MODERATE (4-7): interferes with normal activities or awakens from sleep, tender to touch    - SEVERE (8-10): excruciating pain, doubled over, unable to do any normal activities       moderate 7. RECURRENT SYMPTOM: "Have you ever had this type of abdominal pain before?" If so, ask: "When was the last time?" and "What happened that time?"      Yes GERD 8. CAUSE: "What do you think is causing the abdominal pain?"     Stopping zantac 9. RELIEVING/AGGRAVATING FACTORS: "What makes it better or worse?" (e.g., movement, antacids, bowel movement)     Tums 10. OTHER SYMPTOMS: "Has there been any vomiting, diarrhea, constipation, or urine problems?"       No.   BM today  Protocols used: ABDOMINAL PAIN - MALE-A-AH

## 2017-11-26 ENCOUNTER — Ambulatory Visit: Payer: BC Managed Care – PPO | Admitting: Family Medicine

## 2017-11-26 ENCOUNTER — Other Ambulatory Visit: Payer: Self-pay

## 2017-11-26 ENCOUNTER — Encounter: Payer: Self-pay | Admitting: Family Medicine

## 2017-11-26 VITALS — BP 122/98 | HR 66 | Temp 98.4°F | Ht 72.0 in | Wt 209.4 lb

## 2017-11-26 DIAGNOSIS — Z23 Encounter for immunization: Secondary | ICD-10-CM

## 2017-11-26 DIAGNOSIS — K219 Gastro-esophageal reflux disease without esophagitis: Secondary | ICD-10-CM

## 2017-11-26 DIAGNOSIS — R1010 Upper abdominal pain, unspecified: Secondary | ICD-10-CM

## 2017-11-26 MED ORDER — FAMOTIDINE 20 MG PO TABS: 20.0000 mg | ORAL_TABLET | Freq: Every day | ORAL | 1 refills | Status: DC

## 2017-11-26 NOTE — Progress Notes (Signed)
   Subjective:    Patient ID: Gilbert Cook, male    DOB: Mar 19, 1985, 32 y.o.   MRN: [id number]  HPI   Patient presents to clinic due to pain in upper abdomen and feelings of heartburn.  Patient was taking Protonix and Zantac in combination to control acid reflux symptoms, but stopped Zantac approximately a week ago due to recall on medication.  States when he stopped Zantac the pain seemed to worsen.  Patient has seen GI in the past, but patient states he has never had endoscopy.  Patient states he has a hard time cutting out caffeine and other foods he knows are making his acid reflux symptoms worse.   Patient Active Problem List   Diagnosis Date Noted  . GERD without esophagitis 11/28/2017  . Other hyperlipidemia 06/16/2016  . Abdominal pain 05/23/2016  . Hypertension 05/23/2016  . Palpitations 04/28/2016  . Chest wall pain 04/28/2016  . Generalized anxiety disorder 03/18/2013  . Encounter for preventive health examination 12/17/2012  . Hepatomegaly 12/17/2012   Social History   Tobacco Use  . Smoking status: Former Smoker    Packs/day: 0.50    Years: 5.00    Pack years: 2.50    Types: Cigarettes    Last attempt to quit: 12/15/2010    Years since quitting: 6.9  . Smokeless tobacco: Never Used  Substance Use Topics  . Alcohol use: Yes   Review of Systems  Constitutional: Negative for chills, fatigue and fever.  HENT: Negative for congestion, ear pain, sinus pain and sore throat.   Eyes: Negative.   Respiratory: Negative for cough, shortness of breath and wheezing.   Cardiovascular: Negative for chest pain, palpitations and leg swelling.  Gastrointestinal: +upper ABD pain. Negative diarrhea, nausea and vomiting.  Genitourinary: Negative for dysuria, frequency and urgency.  Musculoskeletal: Negative for arthralgias and myalgias.  Skin: Negative for color change, pallor and rash.  Neurological: Negative for syncope, light-headedness and headaches.    Psychiatric/Behavioral: The patient is not nervous/anxious.       Objective:   Physical Exam  Constitutional: He appears well-developed and well-nourished. No distress.  Head: Normocephalic and atraumatic.  Eyes: Conjunctivae and EOM are normal. No scleral icterus.  Neck: Normal range of motion. Neck supple. No tracheal deviation present.  Cardiovascular: Normal rate, regular rhythm and normal heart sounds.  Pulmonary/Chest: Effort normal and breath sounds normal. No respiratory distress. He has no wheezes. He has no rales.  Abdominal: Soft. Bowel sounds are normal. +epigastric tenderness Neurological: He is alert and oriented to person, place, and time.  Gait normal  Skin: Skin is warm and dry. He is not diaphoretic. No pallor.  Psychiatric: He has a normal mood and affect. His behavior is normal. Thought content normal.   Nursing note and vitals reviewed.    Vitals:   11/26/17 1636  BP: (!) 122/98  Pulse: 66  Temp: 98.4 F (36.9 C)  SpO2: 95%    Assessment & Plan:   Pain of upper abdomen/GERD - patient will continue Protonix 40 mg once daily.  We will also add Pepcid 20 mg once daily at bedtime.  Patient will make another appointment with GI due to his continued acid reflux symptoms even with taking Protonix daily.  Patient given handout outlining foods to eat and foods to avoid with acid reflux.  Flu vaccine given in clinic.  Keep regularly scheduled follow-up as already planned.  Return to clinic sooner if symptoms persist or worsen.

## 2017-11-26 NOTE — Patient Instructions (Addendum)
Call [facility] for appt : [phone], You saw Dr Bonna Gains in November 2018 and should not need a new referral   Food Choices for Gastroesophageal Reflux Disease, Adult When you have gastroesophageal reflux disease (GERD), the foods you eat and your eating habits are very important. Choosing the right foods can help ease your discomfort. What guidelines do I need to follow?  Choose fruits, vegetables, whole grains, and low-fat dairy products.  Choose low-fat meat, fish, and poultry.  Limit fats such as oils, salad dressings, butter, nuts, and avocado.  Keep a food diary. This helps you identify foods that cause symptoms.  Avoid foods that cause symptoms. These may be different for everyone.  Eat small meals often instead of 3 large meals a day.  Eat your meals slowly, in a place where you are relaxed.  Limit fried foods.  Cook foods using methods other than frying.  Avoid drinking alcohol.  Avoid drinking large amounts of liquids with your meals.  Avoid bending over or lying down until 2-3 hours after eating. What foods are not recommended? These are some foods and drinks that may make your symptoms worse: Vegetables Tomatoes. Tomato juice. Tomato and spaghetti sauce. Chili peppers. Onion and garlic. Horseradish. Fruits Oranges, grapefruit, and lemon (fruit and juice). Meats High-fat meats, fish, and poultry. This includes hot dogs, ribs, ham, sausage, salami, and bacon. Dairy Whole milk and chocolate milk. Sour cream. Cream. Butter. Ice cream. Cream cheese. Drinks Coffee and tea. Bubbly (carbonated) drinks or energy drinks. Condiments Hot sauce. Barbecue sauce. Sweets/Desserts Chocolate and cocoa. Donuts. Peppermint and spearmint. Fats and Oils High-fat foods. This includes [place] fries and potato chips. Other Vinegar. Strong spices. This includes black pepper, white pepper, red pepper, cayenne, curry powder, cloves, ginger, and chili powder. The items listed  above may not be a complete list of foods and drinks to avoid. Contact your dietitian for more information. This information is not intended to replace advice given to you by your health care provider. Make sure you discuss any questions you have with your health care provider. Document Released: 08/18/2011 Document Revised: 07/25/2015 Document Reviewed: 12/21/2012 Elsevier Interactive Patient Education  2017 [employer].

## 2017-11-27 ENCOUNTER — Other Ambulatory Visit: Payer: Self-pay | Admitting: Internal Medicine

## 2017-11-28 DIAGNOSIS — K219 Gastro-esophageal reflux disease without esophagitis: Secondary | ICD-10-CM | POA: Insufficient documentation

## 2018-01-02 ENCOUNTER — Other Ambulatory Visit: Payer: Self-pay | Admitting: Internal Medicine

## 2018-01-05 ENCOUNTER — Ambulatory Visit: Payer: BC Managed Care – PPO | Admitting: Internal Medicine

## 2018-01-05 ENCOUNTER — Encounter: Payer: Self-pay | Admitting: Internal Medicine

## 2018-01-05 VITALS — BP 128/84 | HR 70 | Temp 97.6°F | Ht 72.0 in | Wt 209.0 lb

## 2018-01-05 DIAGNOSIS — R1010 Upper abdominal pain, unspecified: Secondary | ICD-10-CM

## 2018-01-05 DIAGNOSIS — E7849 Other hyperlipidemia: Secondary | ICD-10-CM

## 2018-01-05 DIAGNOSIS — R5383 Other fatigue: Secondary | ICD-10-CM | POA: Diagnosis not present

## 2018-01-05 DIAGNOSIS — I10 Essential (primary) hypertension: Secondary | ICD-10-CM | POA: Diagnosis not present

## 2018-01-05 DIAGNOSIS — K219 Gastro-esophageal reflux disease without esophagitis: Secondary | ICD-10-CM

## 2018-01-05 DIAGNOSIS — Z79899 Other long term (current) drug therapy: Secondary | ICD-10-CM | POA: Diagnosis not present

## 2018-01-05 DIAGNOSIS — Z0000 Encounter for general adult medical examination without abnormal findings: Secondary | ICD-10-CM

## 2018-01-05 DIAGNOSIS — F411 Generalized anxiety disorder: Secondary | ICD-10-CM | POA: Diagnosis not present

## 2018-01-05 MED ORDER — METOPROLOL SUCCINATE ER 25 MG PO TB24: 25.0000 mg | ORAL_TABLET | Freq: Every day | ORAL | 3 refills | Status: DC

## 2018-01-05 MED ORDER — FAMOTIDINE 20 MG PO TABS: 20.0000 mg | ORAL_TABLET | Freq: Every day | ORAL | 2 refills | Status: AC

## 2018-01-05 MED ORDER — BUSPIRONE HCL 10 MG PO TABS: 10.0000 mg | ORAL_TABLET | Freq: Three times a day (TID) | ORAL | 0 refills | Status: AC

## 2018-01-05 NOTE — Progress Notes (Signed)
Subjective:  Patient ID: Gilbert Cook, male    DOB: [birth year]  Age: 32 y.o. MRN: [id number]  CC: The primary encounter diagnosis was Other hyperlipidemia. Diagnoses of Essential hypertension, Long-term use of high-risk medication, Fatigue, unspecified type, Pain of upper abdomen, GERD without esophagitis, Encounter for preventive health examination, and Generalized anxiety disorder were also pertinent to this visit.  HPI Gilbert Cook presents for follow up   Last seen oct 2018. Referred to GI for EGD . Classic GERD:  protonix and zantac prescribed.  Changed to pepcid last month by NP. Lifestyle modifications .  using wedge pillow which has helped.    Has been doing 10 day detox diet,  Gave up coffee   Felt great . GERD improved.   Not exercising   Has reduced dose of zoloft to 25 mg down from 75mg   Because he feels it hasn't helped.  Feels fine for a few days then has a day of Feeling panicky and overwhelmed . Denies panic attacks,  but requesting alprazolam because he has taken his wife's tablets and feels better.   Outpatient Medications Prior to Visit  Medication Sig Dispense Refill  . aspirin EC 81 MG tablet Take 1 tablet (81 mg total) by mouth daily. 90 tablet 3  . Misc. Devices MISC 1 wedge pillow for nightly use due to chronic acid reflux 1 each 0  . Multiple Vitamin (MULTIVITAMIN) tablet Take 1 tablet by mouth daily.    . pantoprazole (PROTONIX) 40 MG tablet TAKE 1 TABLET BY MOUTH ONCE DAILY 90 tablet 0  . famotidine (PEPCID) 20 MG tablet Take 1 tablet (20 mg total) by mouth at bedtime. 90 tablet 1  . metoprolol succinate (TOPROL-XL) 25 MG 24 hr tablet TAKE 1 TABLET BY MOUTH ONCE DAILY 30 tablet 1  . ranitidine (ZANTAC) 150 MG tablet TAKE 1 TABLET BY MOUTH AT BEDTIME 90 tablet 3  . sertraline (ZOLOFT) 50 MG tablet Take 1.5 tablets (75 mg total) by mouth daily. 135 tablet 2   No facility-administered medications prior to visit.     Review of Systems;  Patient denies  headache, fevers, malaise, unintentional weight loss, skin rash, eye pain, sinus congestion and sinus pain, sore throat, dysphagia,  hemoptysis , cough, dyspnea, wheezing, chest pain, palpitations, orthopnea, edema, abdominal pain, nausea, melena, diarrhea, constipation, flank pain, dysuria, hematuria, urinary  Frequency, nocturia, numbness, tingling, seizures,  Focal weakness, Loss of consciousness,  Tremor, insomnia, depression,, and suicidal ideation.      Objective:  BP 128/84   Pulse 70   Temp 97.6 F (36.4 C) (Oral)   Ht 6' (1.829 m)   Wt 209 lb (94.8 kg)   SpO2 94%   BMI 28.35 kg/m   BP Readings from Last 3 Encounters:  01/05/18 128/84  11/26/17 (!) 122/98  01/19/17 135/78    Wt Readings from Last 3 Encounters:  01/05/18 209 lb (94.8 kg)  11/26/17 209 lb 6.4 oz (95 kg)  01/19/17 198 lb 3.2 oz (89.9 kg)    General appearance: alert, cooperative and appears stated age Ears: normal TM's and external ear canals both ears Throat: lips, mucosa, and tongue normal; teeth and gums normal Neck: no adenopathy, no carotid bruit, supple, symmetrical, trachea midline and thyroid not enlarged, symmetric, no tenderness/mass/nodules Back: symmetric, no curvature. ROM normal. No CVA tenderness. Lungs: clear to auscultation bilaterally Heart: regular rate and rhythm, S1, S2 normal, no murmur, click, rub or gallop Abdomen: soft, non-tender; bowel sounds normal; no masses,  no  organomegaly Pulses: 2+ and symmetric Skin: Skin color, texture, turgor normal. No rashes or lesions Lymph nodes: Cervical, supraclavicular, and axillary nodes normal.  No results found for: HGBA1C  Lab Results  Component Value Date   CREATININE 1.14 01/05/2018   CREATININE 1.02 04/27/2016   CREATININE 1.22 04/23/2016    Lab Results  Component Value Date   WBC 8.7 01/05/2018   HGB 16.6 01/05/2018   HCT 48.0 01/05/2018   PLT 263.0 01/05/2018   GLUCOSE 78 01/05/2018   CHOL 270 (H) 01/05/2018   TRIG 145.0  01/05/2018   HDL 40.40 01/05/2018   LDLDIRECT 197.3 12/14/2012   LDLCALC 201 (H) 01/05/2018   ALT 30 01/05/2018   AST 28 01/05/2018   NA 133 (L) 01/05/2018   K 4.1 01/05/2018   CL 97 01/05/2018   CREATININE 1.14 01/05/2018   BUN 13 01/05/2018   CO2 28 01/05/2018   TSH 3.45 04/27/2016    No results found.  Assessment & Plan:   Problem List Items Addressed This Visit    Abdominal pain   Relevant Medications   famotidine (PEPCID) 20 MG tablet   Encounter for preventive health examination    age appropriate education and counseling updated, referrals for preventative services and immunizations addressed, dietary and smoking counseling addressed, most recent labs reviewed.  I have personally reviewed and have noted:  1) the patient's medical and social history 2) The pt's use of alcohol, tobacco, and illicit drugs 3) The patient's current medications and supplements 4) Functional ability including ADL's, fall risk, home safety risk, hearing and visual impairment 5) Diet and physical activities 6) Evidence for depression or mood disorder 7) The patient's height, weight, and BMI have been recorded in the chart  I have made referrals, and provided counseling and education based on review of the above      Generalized anxiety disorder    Stopping zoloft due to lack of appreciable effect .  Alprazolam request denied due to risk of addiction and absence of true panic attacks. .  starting buspirone       Relevant Medications   busPIRone (BUSPAR) 10 MG tablet   GERD without esophagitis    Improved symptoms with dietary , lifestyle modifications and use of PPI . no changes today      Relevant Medications   famotidine (PEPCID) 20 MG tablet   Hypertension    he reports compliance with medication regimen  And BP is at  Goal on metoprolol . No changes today  Lab Results  Component Value Date   CREATININE 1.14 01/05/2018   Lab Results  Component Value Date   NA 133 (L)  01/05/2018   K 4.1 01/05/2018   CL 97 01/05/2018   CO2 28 01/05/2018         Relevant Medications   metoprolol succinate (TOPROL-XL) 25 MG 24 hr tablet   Other Relevant Orders   Comprehensive metabolic panel (Completed)   Other hyperlipidemia - Primary    LDL is elevated.  Given his age and lack of FH ,  No treatment is recommended  Lab Results  Component Value Date   CHOL 270 (H) 01/05/2018   HDL 40.40 01/05/2018   LDLCALC 201 (H) 01/05/2018   LDLDIRECT 197.3 12/14/2012   TRIG 145.0 01/05/2018   CHOLHDL 7 01/05/2018         Relevant Medications   metoprolol succinate (TOPROL-XL) 25 MG 24 hr tablet   Other Relevant Orders   Lipid panel (Completed)  Other Visit Diagnoses    Long-term use of high-risk medication       Relevant Orders   Vitamin B12 (Completed)   VITAMIN D 25 Hydroxy (Vit-D Deficiency, Fractures) (Completed)   Fatigue, unspecified type       Relevant Orders   CBC with Differential/Platelet (Completed)      I have discontinued [place]. Manny's sertraline and ranitidine. I have also changed his metoprolol succinate. Additionally, I am having him start on busPIRone. Lastly, I am having him maintain his multivitamin, aspirin EC, Misc. Devices, pantoprazole, and famotidine.  Meds ordered this encounter  Medications  . busPIRone (BUSPAR) 10 MG tablet    Sig: Take 1 tablet (10 mg total) by mouth 3 (three) times daily. As needed for anxiety    Dispense:  90 tablet    Refill:  0  . famotidine (PEPCID) 20 MG tablet    Sig: Take 1 tablet (20 mg total) by mouth at bedtime.    Dispense:  90 tablet    Refill:  2  . metoprolol succinate (TOPROL-XL) 25 MG 24 hr tablet    Sig: Take 1 tablet (25 mg total) by mouth daily.    Dispense:  90 tablet    Refill:  3    Medications Discontinued During This Encounter  Medication Reason  . sertraline (ZOLOFT) 50 MG tablet   . ranitidine (ZANTAC) 150 MG tablet   . famotidine (PEPCID) 20 MG tablet Reorder  .  metoprolol succinate (TOPROL-XL) 25 MG 24 hr tablet Reorder    Follow-up: No follow-ups on file.   Crecencio Mc, MD

## 2018-01-05 NOTE — Patient Instructions (Addendum)
Check on your Tetanus vaccine status,  Is is due every 10 years  Trial  Of  Buspirone 10 mg if needed for anxiety  May take up to 3 times daily   Reduce  Your zoloft to 1/2 tablet every other day for one week,  Then stop    Exercise rigorously for 30 minutes up to 5 times weekly     Health Maintenance, Male A healthy lifestyle and preventive care is important for your health and wellness. Ask your health care provider about what schedule of regular examinations is right for you. What should I know about weight and diet? Eat a Healthy Diet  Eat plenty of vegetables, fruits, whole grains, low-fat dairy products, and lean protein.  Do not eat a lot of foods high in solid fats, added sugars, or salt.  Maintain a Healthy Weight Regular exercise can help you achieve or maintain a healthy weight. You should:  Do at least 150 minutes of exercise each week. The exercise should increase your heart rate and make you sweat (moderate-intensity exercise).  Do strength-training exercises at least twice a week.  Watch Your Levels of Cholesterol and Blood Lipids  Have your blood tested for lipids and cholesterol every 5 years starting at 34 years of age. If you are at high risk for heart disease, you should start having your blood tested when you are 34 years old. You may need to have your cholesterol levels checked more often if: ? Your lipid or cholesterol levels are high. ? You are older than 34 years of age. ? You are at high risk for heart disease.  What should I know about cancer screening? Many types of cancers can be detected early and may often be prevented. Lung Cancer  You should be screened every year for lung cancer if: ? You are a current smoker who has smoked for at least 30 years. ? You are a former smoker who has quit within the past 15 years.  Talk to your health care provider about your screening options, when you should start screening, and how often you should be  screened.  Colorectal Cancer  Routine colorectal cancer screening usually begins at 34 years of age and should be repeated every 5-10 years until you are 34 years old. You may need to be screened more often if early forms of precancerous polyps or small growths are found. Your health care provider may recommend screening at an earlier age if you have risk factors for colon cancer.  Your health care provider may recommend using home test kits to check for hidden blood in the stool.  A small camera at the end of a tube can be used to examine your colon (sigmoidoscopy or colonoscopy). This checks for the earliest forms of colorectal cancer.  Prostate and Testicular Cancer  Depending on your age and overall health, your health care provider may do certain tests to screen for prostate and testicular cancer.  Talk to your health care provider about any symptoms or concerns you have about testicular or prostate cancer.  Skin Cancer  Check your skin from head to toe regularly.  Tell your health care provider about any new moles or changes in moles, especially if: ? There is a change in a mole's size, shape, or color. ? You have a mole that is larger than a pencil eraser.  Always use sunscreen. Apply sunscreen liberally and repeat throughout the day.  Protect yourself by wearing long sleeves, pants, a wide-brimmed hat,  and sunglasses when outside.  What should I know about heart disease, diabetes, and high blood pressure?  If you are 81-53 years of age, have your blood pressure checked every 3-5 years. If you are 52 years of age or older, have your blood pressure checked every year. You should have your blood pressure measured twice-once when you are at a hospital or clinic, and once when you are not at a hospital or clinic. Record the average of the two measurements. To check your blood pressure when you are not at a hospital or clinic, you can use: ? An automated blood pressure machine at a  pharmacy. ? A home blood pressure monitor.  Talk to your health care provider about your target blood pressure.  If you are between 51-29 years old, ask your health care provider if you should take aspirin to prevent heart disease.  Have regular diabetes screenings by checking your fasting blood sugar level. ? If you are at a normal weight and have a low risk for diabetes, have this test once every three years after the age of 44. ? If you are overweight and have a high risk for diabetes, consider being tested at a younger age or more often.  A one-time screening for abdominal aortic aneurysm (AAA) by ultrasound is recommended for men aged 83-75 years who are current or former smokers. What should I know about preventing infection? Hepatitis B If you have a higher risk for hepatitis B, you should be screened for this virus. Talk with your health care provider to find out if you are at risk for hepatitis B infection. Hepatitis C Blood testing is recommended for:  Everyone born from [birth year] through 1967.  Anyone with known risk factors for hepatitis C.  Sexually Transmitted Diseases (STDs)  You should be screened each year for STDs including gonorrhea and chlamydia if: ? You are sexually active and are younger than 34 years of age. ? You are older than 34 years of age and your health care provider tells you that you are at risk for this type of infection. ? Your sexual activity has changed since you were last screened and you are at an increased risk for chlamydia or gonorrhea. Ask your health care provider if you are at risk.  Talk with your health care provider about whether you are at high risk of being infected with HIV. Your health care provider may recommend a prescription medicine to help prevent HIV infection.  What else can I do?  Schedule regular health, dental, and eye exams.  Stay current with your vaccines (immunizations).  Do not use any tobacco products, such as  cigarettes, chewing tobacco, and e-cigarettes. If you need help quitting, ask your health care provider.  Limit alcohol intake to no more than 2 drinks per day. One drink equals 12 ounces of beer, 5 ounces of wine, or 1 ounces of hard liquor.  Do not use street drugs.  Do not share needles.  Ask your health care provider for help if you need support or information about quitting drugs.  Tell your health care provider if you often feel depressed.  Tell your health care provider if you have ever been abused or do not feel safe at home. This information is not intended to replace advice given to you by your health care provider. Make sure you discuss any questions you have with your health care provider. Document Released: 08/15/2007 Document Revised: 10/16/2015 Document Reviewed: 11/20/2014 Elsevier Interactive Patient Education  2018 [employer]  

## 2018-01-06 LAB — COMPREHENSIVE METABOLIC PANEL
ALT: 30 U/L (ref 0–53)
AST: 28 U/L (ref 0–37)
Albumin: 4.9 g/dL (ref 3.5–5.2)
Alkaline Phosphatase: 58 U/L (ref 39–117)
BUN: 13 mg/dL (ref 6–23)
CALCIUM: 9.3 mg/dL (ref 8.4–10.5)
CHLORIDE: 97 meq/L (ref 96–112)
CO2: 28 mEq/L (ref 19–32)
Creatinine, Ser: 1.14 mg/dL (ref 0.40–1.50)
GFR: 77.83 mL/min (ref >60.00)
Glucose, Bld: 78 mg/dL (ref 70–99)
Potassium: 4.1 mEq/L (ref 3.5–5.1)
Sodium: 133 mEq/L — ABNORMAL LOW (ref 135–145)
Total Bilirubin: 0.8 mg/dL (ref 0.2–1.2)
Total Protein: 7.6 g/dL (ref 6.0–8.3)

## 2018-01-06 LAB — VITAMIN B12: VITAMIN B 12: 550 pg/mL (ref 211–911)

## 2018-01-06 LAB — CBC WITH DIFFERENTIAL/PLATELET
BASOS ABS: 0.1 10*3/uL (ref 0.0–0.1)
Basophils Relative: 0.6 % (ref 0.0–3.0)
EOS ABS: 0.3 10*3/uL (ref 0.0–0.7)
Eosinophils Relative: 3.5 % (ref 0.0–5.0)
HCT: 48 % (ref 39.0–52.0)
Hemoglobin: 16.6 g/dL (ref 13.0–17.0)
LYMPHS ABS: 3.8 10*3/uL (ref 0.7–4.0)
Lymphocytes Relative: 43.7 % (ref 12.0–46.0)
MCHC: 34.6 g/dL (ref 30.0–36.0)
MCV: 86.5 fl (ref 78.0–100.0)
MONO ABS: 0.7 10*3/uL (ref 0.1–1.0)
MONOS PCT: 8 % (ref 3.0–12.0)
NEUTROS PCT: 44.2 % (ref 43.0–77.0)
Neutro Abs: 3.8 10*3/uL (ref 1.4–7.7)
Platelets: 263 10*3/uL (ref 150.0–400.0)
RBC: 5.55 Mil/uL (ref 4.22–5.81)
RDW: 13.5 % (ref 11.5–15.5)
WBC: 8.7 10*3/uL (ref 4.0–10.5)

## 2018-01-06 LAB — LIPID PANEL
CHOL/HDL RATIO: 7
Cholesterol: 270 mg/dL — ABNORMAL HIGH (ref 0–200)
HDL: 40.4 mg/dL (ref >39.00)
LDL Cholesterol: 201 mg/dL — ABNORMAL HIGH (ref 0–99)
NONHDL: 229.9
Triglycerides: 145 mg/dL (ref 0.0–149.0)
VLDL: 29 mg/dL (ref 0.0–40.0)

## 2018-01-06 LAB — VITAMIN D 25 HYDROXY (VIT D DEFICIENCY, FRACTURES): VITD: 32.04 ng/mL (ref 30.00–100.00)

## 2018-01-08 NOTE — Assessment & Plan Note (Signed)
he reports compliance with medication regimen  And BP is at  Goal on metoprolol . No changes today  Lab Results  Component Value Date   CREATININE 1.14 01/05/2018   Lab Results  Component Value Date   NA 133 (L) 01/05/2018   K 4.1 01/05/2018   CL 97 01/05/2018   CO2 28 01/05/2018

## 2018-01-08 NOTE — Assessment & Plan Note (Signed)
Stopping zoloft due to lack of appreciable effect .  Alprazolam request denied due to risk of addiction and absence of true panic attacks. .  starting buspirone

## 2018-01-08 NOTE — Assessment & Plan Note (Signed)

## 2018-01-08 NOTE — Assessment & Plan Note (Signed)
Improved symptoms with dietary , lifestyle modifications and use of PPI . no changes today

## 2018-01-08 NOTE — Assessment & Plan Note (Signed)
LDL is elevated.  Given his age and lack of FH ,  No treatment is recommended  Lab Results  Component Value Date   CHOL 270 (H) 01/05/2018   HDL 40.40 01/05/2018   LDLCALC 201 (H) 01/05/2018   LDLDIRECT 197.3 12/14/2012   TRIG 145.0 01/05/2018   CHOLHDL 7 01/05/2018

## 2018-01-11 ENCOUNTER — Ambulatory Visit: Payer: BC Managed Care – PPO | Admitting: Internal Medicine

## 2018-01-12 IMAGING — US US ABDOMEN LIMITED
1 series · 14 of 25 positions shown · non-contrast
Comparison: None.

CLINICAL DATA: Acute onset of right upper quadrant abdominal pain.
Initial encounter.

EXAM:
US ABDOMEN LIMITED - RIGHT UPPER QUADRANT

[Series 1: us abdomen limited · 0.14mm/px · 14 of 83 slices shown]
[im 1/83]
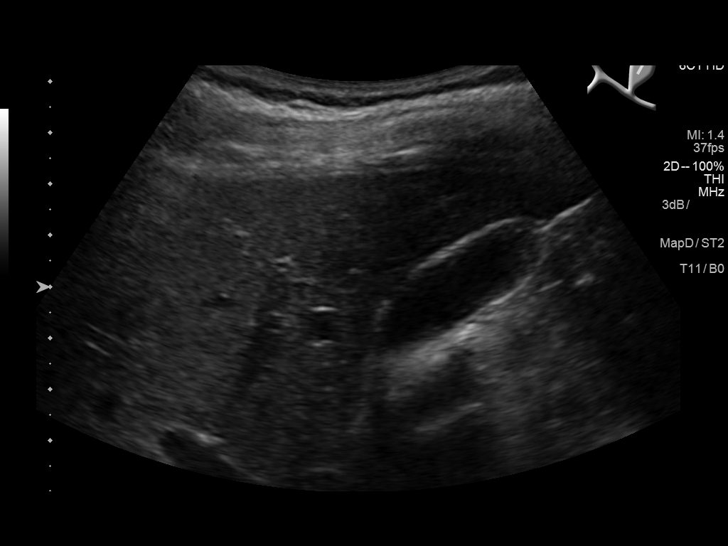
[im 7/83]
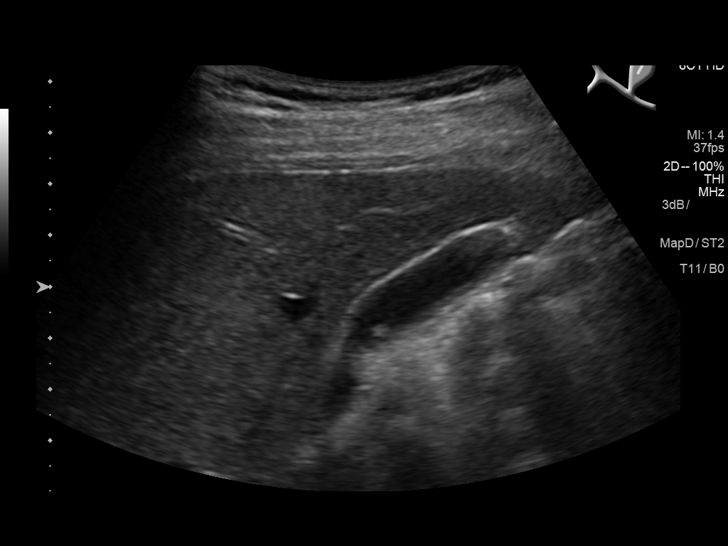
[im 14/83]
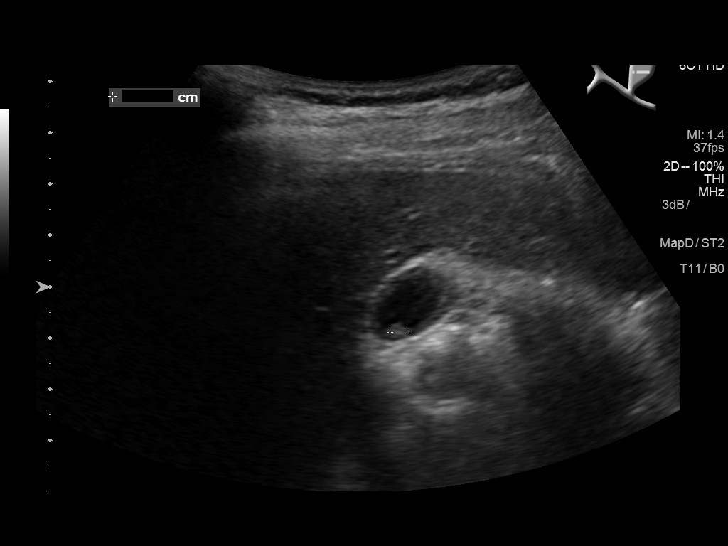
[im 21/83]
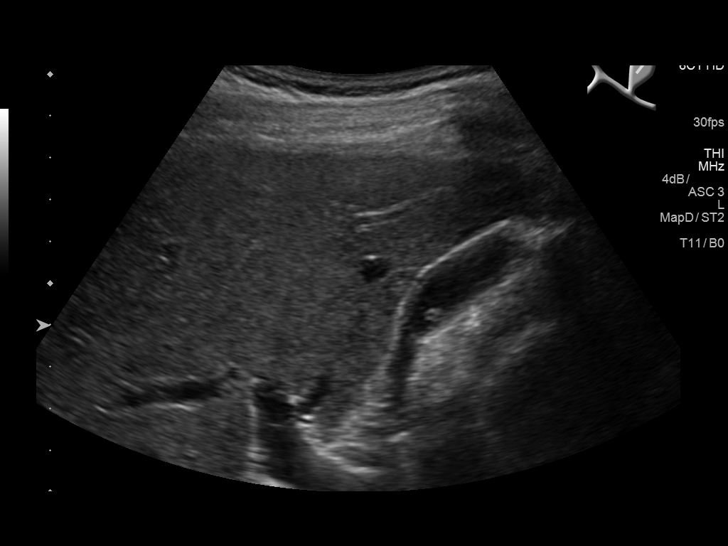
[im 28/83]
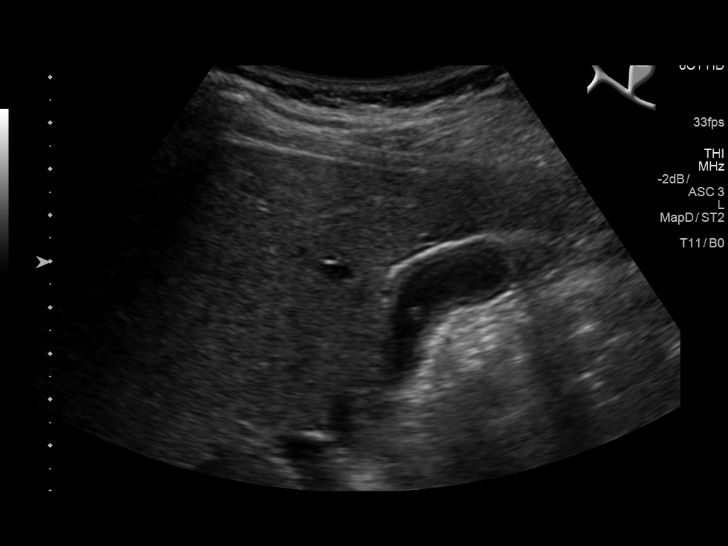
[im 31/83]
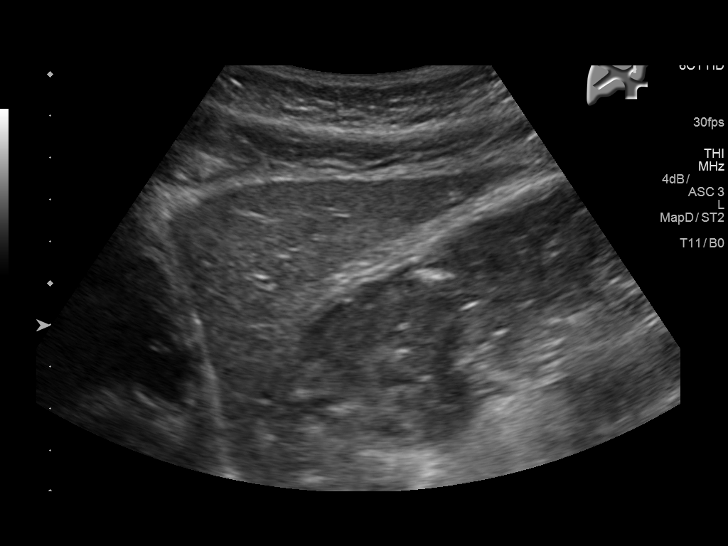
[im 38/83]
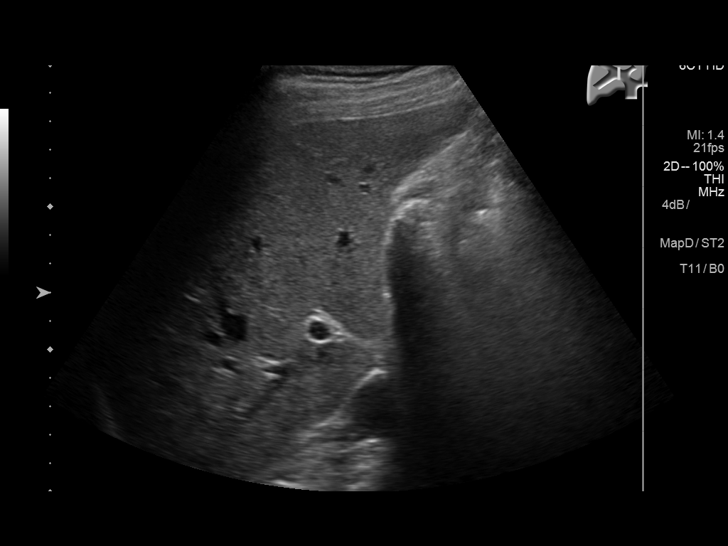
[im 45/83]
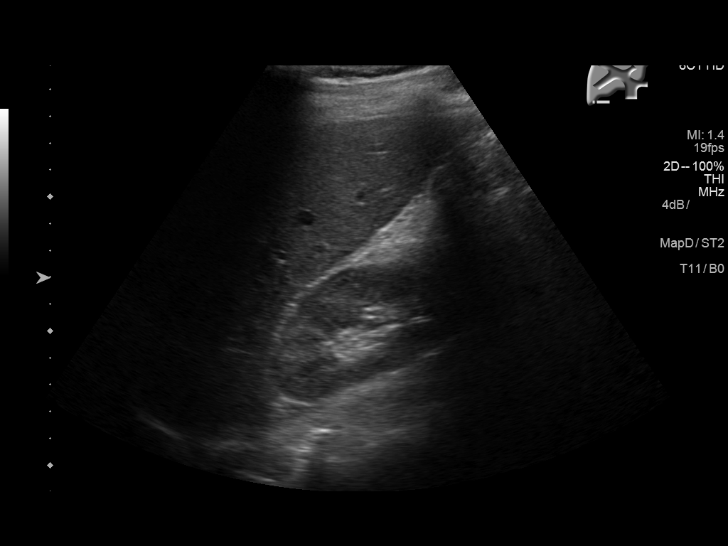
[im 52/83]
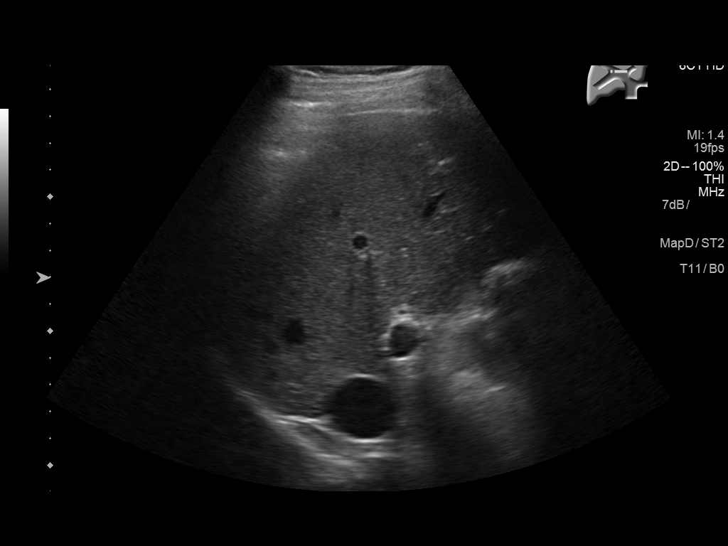
[im 55/83]
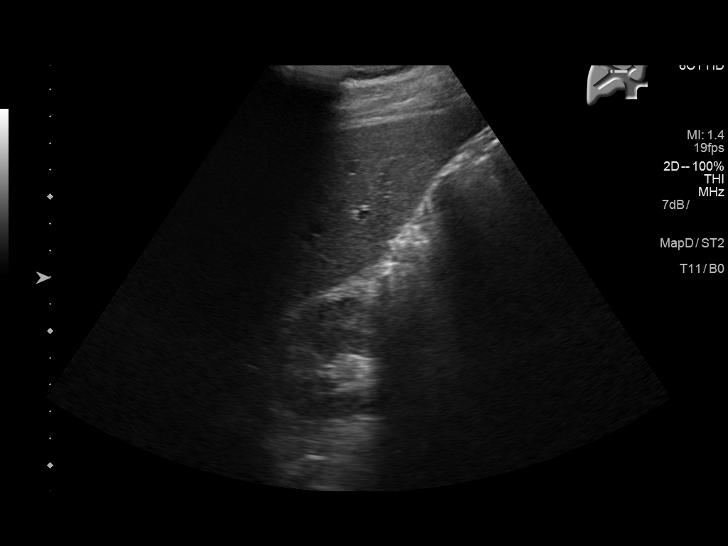
[im 62/83]
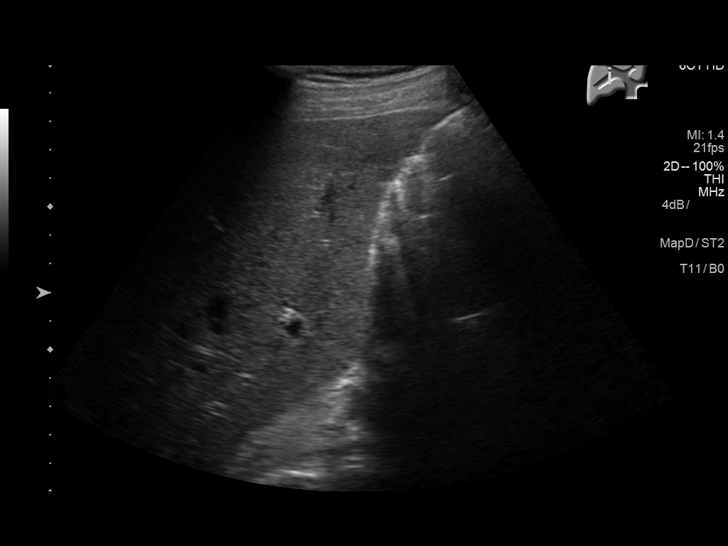
[im 69/83]
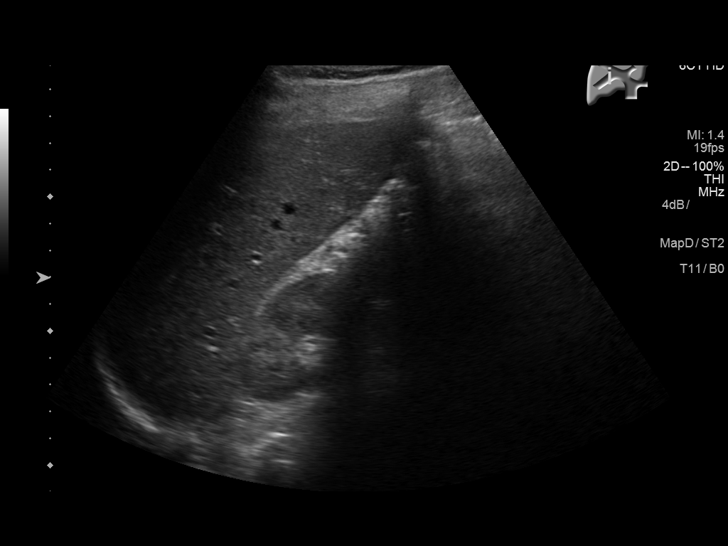
[im 76/83]
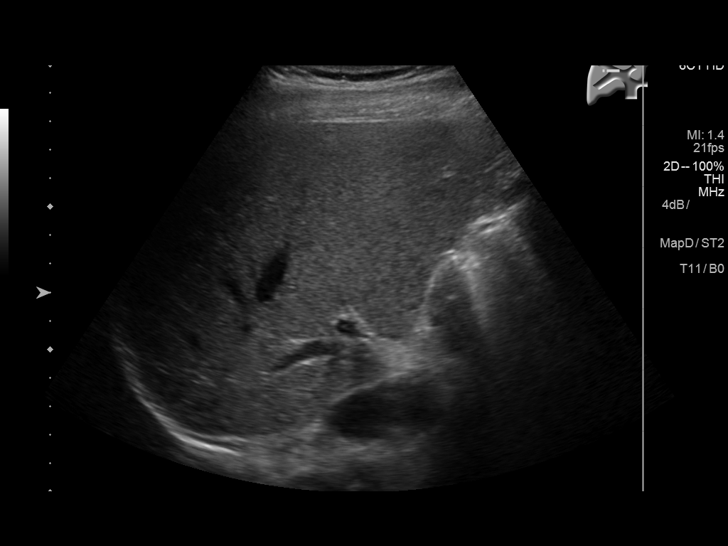
[im 83/83]
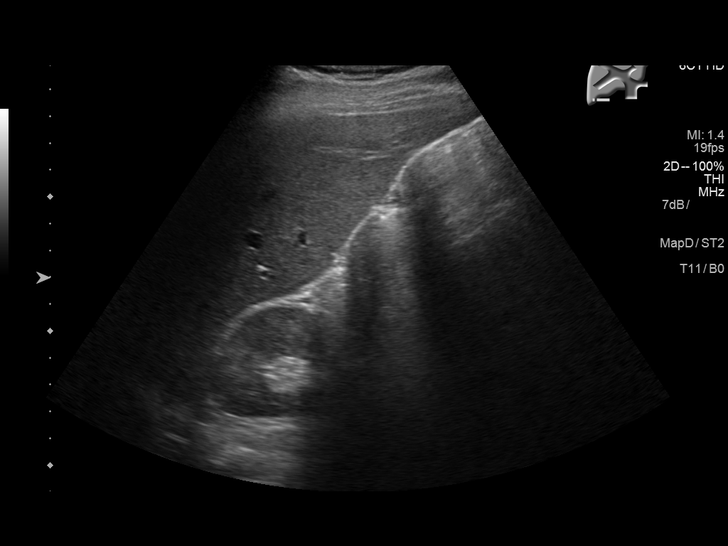

[14 of 25 positions shown; findings below may reference images not displayed]

FINDINGS: Gallbladder:

A 4 mm stone or polyp is noted within the gallbladder. The
gallbladder is otherwise unremarkable. No gallbladder wall
thickening or pericholecystic fluid is seen. No ultrasonographic
Murphy's sign is elicited.

Common bile duct:

Diameter: 0.3 cm, within normal limits in caliber.

Liver:

No focal lesion identified. Within normal limits in parenchymal
echogenicity.
IMPRESSION: 4 mm stone or polyp within the gallbladder. Gallbladder otherwise
unremarkable. No acute abnormality seen at the right upper quadrant.

## 2018-01-13 IMAGING — DX DG CHEST 2V
2 series · 2 of 2 positions shown · non-contrast
Comparison: None in PACs

CLINICAL DATA: Recurrent left-sided chest pain and palpitations for
the past 10 days. Former smoker.

EXAM:
CHEST  2 VIEW

[chest pa]
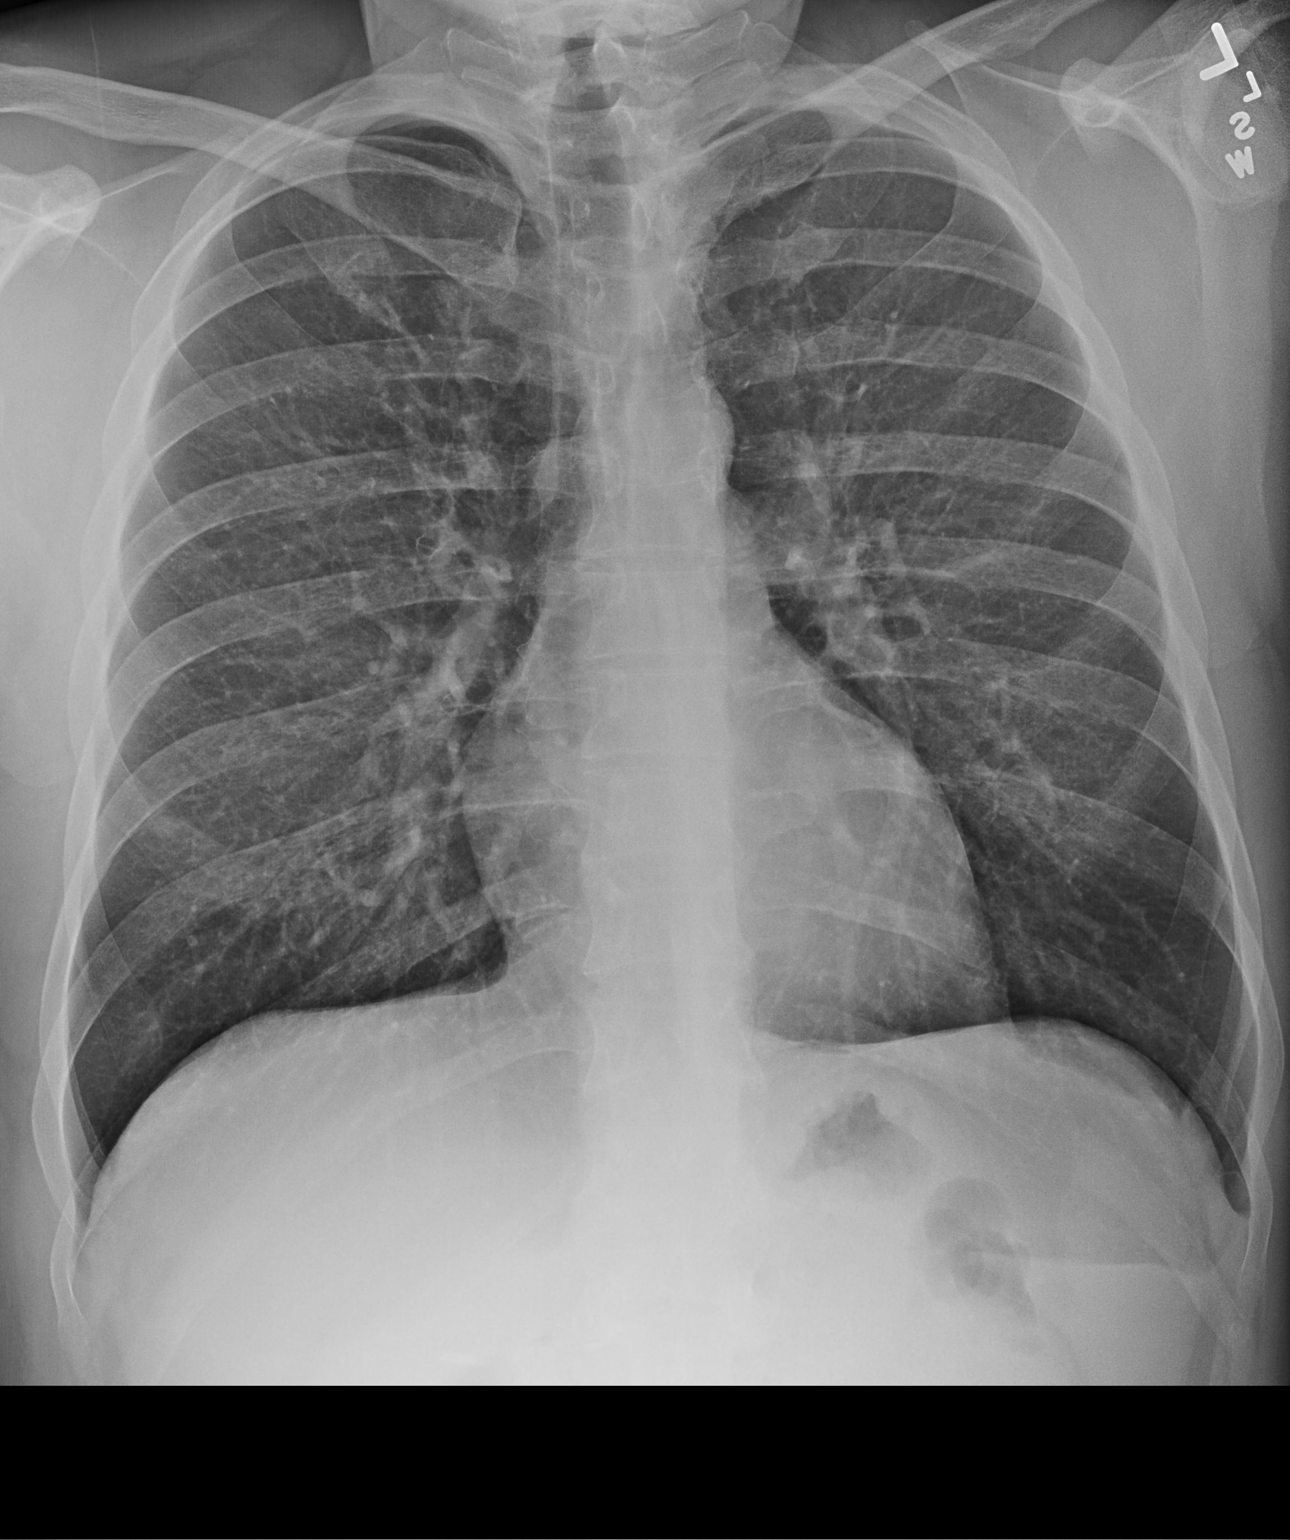

[chest lat]
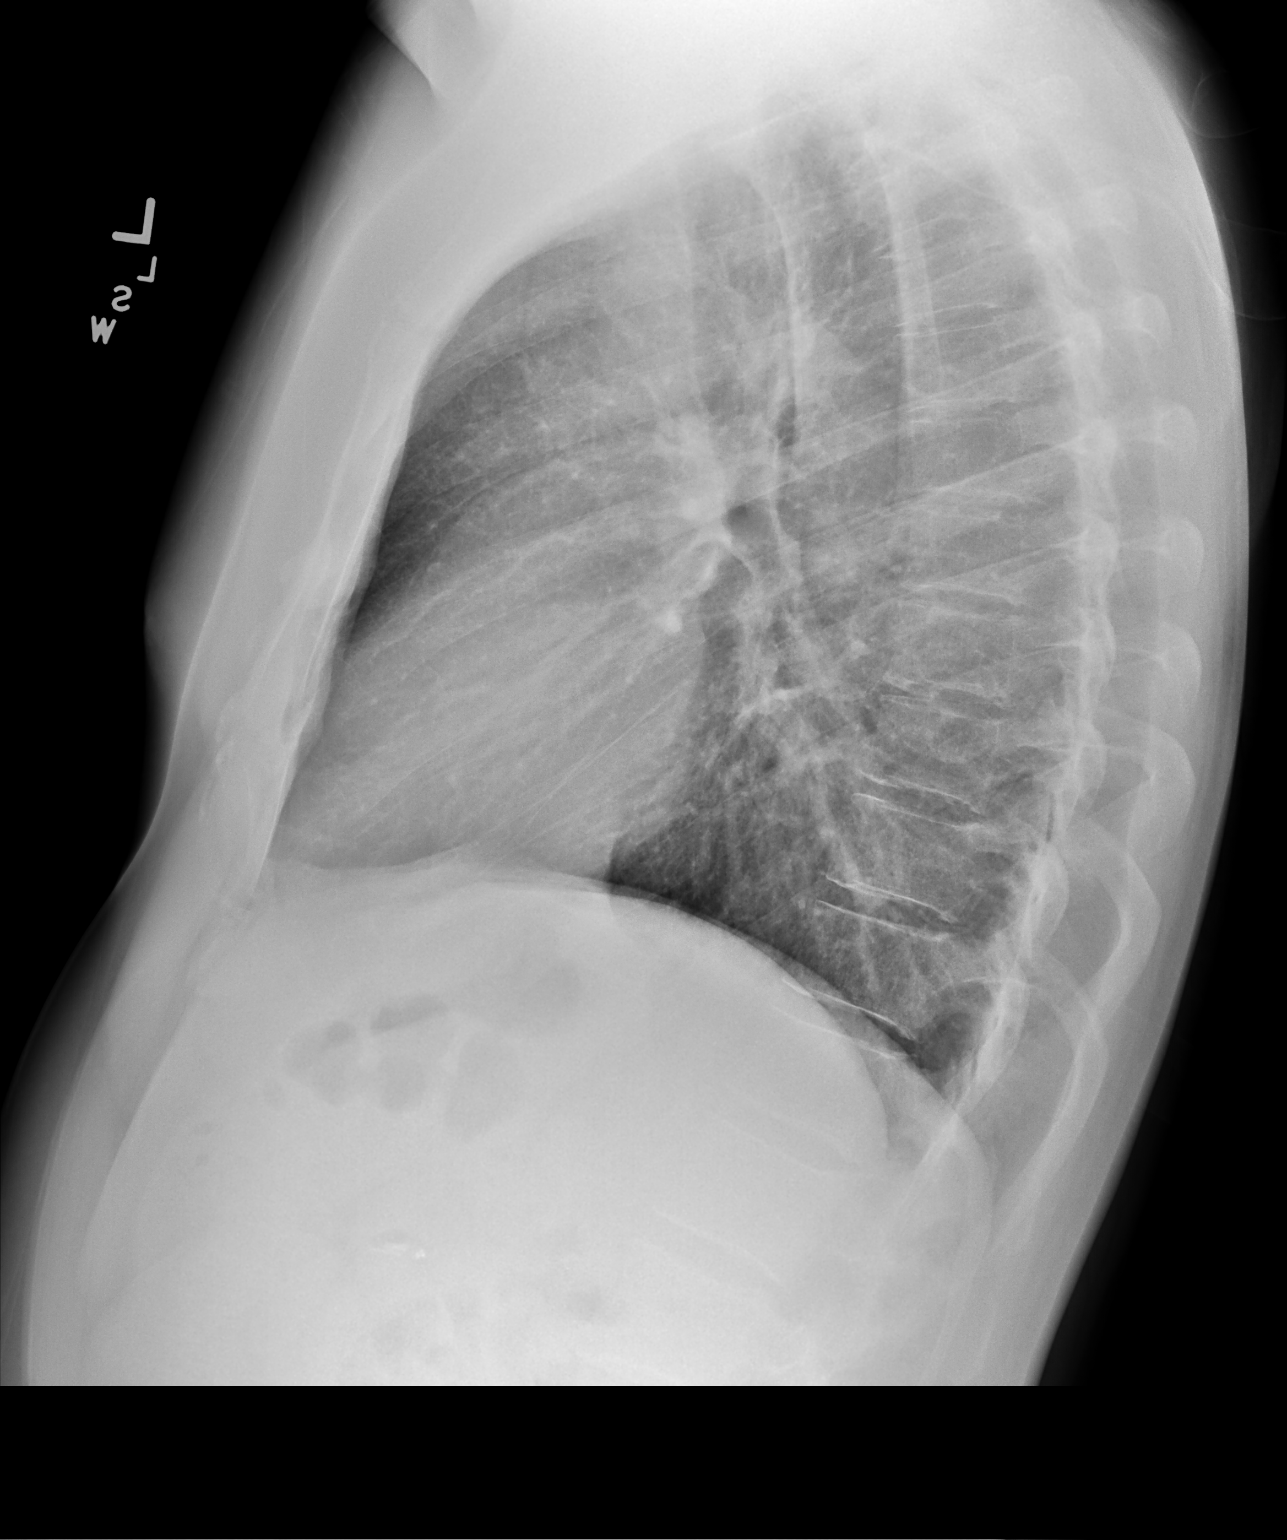

[2 of 2 positions shown; findings below may reference images not displayed]

FINDINGS: The lungs are well-expanded. The interstitial markings are coarse.
There is no alveolar infiltrate or pleural effusion. The heart and
mediastinal structures are normal. The bony thorax exhibits no acute
abnormality.
IMPRESSION: No definite acute cardiopulmonary abnormality. Mild interstitial
prominence may reflect the smoking history or reactive airway
disease.

## 2018-04-08 ENCOUNTER — Other Ambulatory Visit: Payer: Self-pay | Admitting: Internal Medicine

## 2018-07-12 ENCOUNTER — Other Ambulatory Visit: Payer: Self-pay | Admitting: Internal Medicine

## 2018-08-23 ENCOUNTER — Other Ambulatory Visit: Payer: Self-pay | Admitting: Internal Medicine

## 2018-08-23 NOTE — Telephone Encounter (Signed)
This medication has been discontinued 

## 2018-08-24 NOTE — Telephone Encounter (Signed)
Spoke with pt and he stated that he wasn't sure what medication he was taking. He stated that his wife keeps up with his medication and that he would find out from her then give [place] a call back. He also stated that he would schedule his 6 month follow up when he calls back.

## 2018-10-15 ENCOUNTER — Other Ambulatory Visit: Payer: Self-pay | Admitting: Internal Medicine

## 2019-01-16 ENCOUNTER — Other Ambulatory Visit: Payer: Self-pay | Admitting: Internal Medicine

## 2019-01-16 NOTE — Telephone Encounter (Signed)
Refilled: 10/18/2018 Last OV: 01/05/2018 Next OV: not scheduled

## 2019-02-27 ENCOUNTER — Other Ambulatory Visit: Payer: Self-pay | Admitting: Internal Medicine

## 2019-02-27 ENCOUNTER — Encounter: Payer: Self-pay | Admitting: *Deleted

## 2019-02-27 NOTE — Telephone Encounter (Signed)
Sent mychart message to patient for an appt. Once appt is scheduled, will send in enough until his appt. Pt has been seen since March 2019.

## 2019-02-28 NOTE — Telephone Encounter (Signed)
Correction, pt has NOT been seen since March 2019

## 2019-04-01 ENCOUNTER — Other Ambulatory Visit: Payer: Self-pay | Admitting: Internal Medicine

## 2019-04-03 ENCOUNTER — Other Ambulatory Visit: Payer: Self-pay | Admitting: Internal Medicine

## 2019-04-05 ENCOUNTER — Telehealth: Payer: Self-pay | Admitting: Internal Medicine

## 2019-04-05 NOTE — Telephone Encounter (Signed)
Pt has not been seen since 12/2017. Pt has been called, no voicemail and sent a mychart message letting him know that he needs to schedule an office visit so we can refill medications. Appt still has not been scheduled.

## 2019-04-05 NOTE — Telephone Encounter (Signed)
Patient is requesting a refill on his metoprolol succinate (TOPROL-XL) 25 MG 24 hr tablet.

## 2019-04-07 MED ORDER — METOPROLOL SUCCINATE ER 25 MG PO TB24: 25.0000 mg | ORAL_TABLET | Freq: Every day | ORAL | 0 refills | Status: DC

## 2019-04-07 NOTE — Telephone Encounter (Signed)
Medication has been refiled for 30 days and pt's wife is aware that pt must keep his appt scheduled for 04/11/2019.

## 2019-04-07 NOTE — Telephone Encounter (Signed)
Patient's wife called, patient is out of medication. She states that he had no miss calls from this office. She confirmed patients cell phone. Will schedule appt for patient for next week. Could this medication please be filled, wife asking.

## 2019-04-11 ENCOUNTER — Ambulatory Visit (INDEPENDENT_AMBULATORY_CARE_PROVIDER_SITE_OTHER): Payer: BC Managed Care – PPO | Admitting: Internal Medicine

## 2019-04-11 ENCOUNTER — Other Ambulatory Visit: Payer: Self-pay

## 2019-04-11 ENCOUNTER — Encounter: Payer: Self-pay | Admitting: Internal Medicine

## 2019-04-11 DIAGNOSIS — F411 Generalized anxiety disorder: Secondary | ICD-10-CM | POA: Diagnosis not present

## 2019-04-11 DIAGNOSIS — E7849 Other hyperlipidemia: Secondary | ICD-10-CM

## 2019-04-11 DIAGNOSIS — I10 Essential (primary) hypertension: Secondary | ICD-10-CM

## 2019-04-11 NOTE — Assessment & Plan Note (Signed)
he reports compliance with medication regimen  But has not had BP checked in over a year. RN visit needed for BP check.  Lab Results  Component Value Date   CREATININE 1.14 01/05/2018   Lab Results  Component Value Date   NA 133 (L) 01/05/2018   K 4.1 01/05/2018   CL 97 01/05/2018   CO2 28 01/05/2018

## 2019-04-11 NOTE — Assessment & Plan Note (Signed)
LDL is elevated.  Given his age and lack of FH ,  No treatment was recommended.  Repeat assessment is due   Lab Results  Component Value Date   CHOL 270 (H) 01/05/2018   HDL 40.40 01/05/2018   LDLCALC 201 (H) 01/05/2018   LDLDIRECT 197.3 12/14/2012   TRIG 145.0 01/05/2018   CHOLHDL 7 01/05/2018

## 2019-04-11 NOTE — Progress Notes (Signed)
Virtual Visit via [email or web address]  This visit type was conducted due to national recommendations for restrictions regarding the COVID-19 pandemic (e.g. social distancing).  This format is felt to be most appropriate for this patient at this time.  All issues noted in this document were discussed and addressed.  No physical exam was performed (except for noted visual exam findings with Video Visits).   I connected with@ on 04/11/19 at  2:30 PM EST by a video enabled telemedicine application or telephone and verified that I am speaking with the correct person using two identifiers. Location patient: home Location provider: work or home office Persons participating in the virtual visit: patient, provider  I discussed the limitations, risks, security and privacy concerns of performing an evaluation and management service by telephone and the availability of in person appointments. I also discussed with the patient that there may be a patient responsible charge related to this service. The patient expressed understanding and agreed to proceed.  Reason for visit:   HPI:  34 yr old male with history of GAD, hypertension last seen Nov 2019 .  States that he weaned himself off of his anxiety medications a year ago, using only melatonin now for help in sleeping,  occasionally  Has mild panic attacks (1-2/week) ,  Slightly better since resuming metoprolol for BP.Marland Kitchen  Stressors include economic "what if"s  related to job security.  Not afraid of getting sick.    Has not checked BP in over a year.   Not exercising regularly  ROS: Patient denies headache, fevers, malaise, unintentional weight loss, skin rash, eye pain, sinus congestion and sinus pain, sore throat, dysphagia,  hemoptysis , cough, dyspnea, wheezing, chest pain, palpitations, orthopnea, edema, abdominal pain, nausea, melena, diarrhea, constipation, flank pain, dysuria, hematuria, urinary  Frequency, nocturia, numbness, tingling, seizures,  Focal weakness,  Loss of consciousness,  Tremor, insomnia, depression, anxiety, and suicidal ideation.    .  Past Medical History:  Diagnosis Date  . Hypercholesteremia     Past Surgical History:  Procedure Laterality Date  . CHOLECYSTECTOMY N/A 06/06/2015   Procedure: LAPAROSCOPIC CHOLECYSTECTOMY WITH INTRAOPERATIVE CHOLANGIOGRAM;  Surgeon: Jules Husbands, MD;  Location: [facility] ORS;  Service: General;  Laterality: N/A;  . FRACTURE SURGERY Right 2013   wrist (Screw)    Family History  Problem Relation Age of Onset  . Hypertension Mother   . Heart disease Mother   . Hypertension Father   . Cancer Father 72       T cell leukemia  . Hypertension Maternal Grandmother   . Hypertension Maternal Grandfather   . Heart disease Maternal Grandfather   . Heart attack Maternal Grandfather   . Hypertension Paternal Grandmother   . Hypertension Paternal Grandfather   . Depression Maternal Uncle   . Schizophrenia Maternal Uncle     SOCIAL HX:  reports that he quit smoking about 8 years ago. His smoking use included cigarettes. He has a 2.50 pack-year smoking history. He has never used smokeless tobacco. He reports current alcohol use. He reports current drug use. Drug: Marijuana.  Current Outpatient Medications:  .  aspirin EC 81 MG tablet, Take 1 tablet (81 mg total) by mouth daily., Disp: 90 tablet, Rfl: 3 .  famotidine (PEPCID) 20 MG tablet, Take 1 tablet (20 mg total) by mouth at bedtime., Disp: 90 tablet, Rfl: 2 .  metoprolol succinate (TOPROL-XL) 25 MG 24 hr tablet, Take 1 tablet (25 mg total) by mouth daily., Disp: 30 tablet, Rfl: 0 .  Misc.  Devices MISC, 1 wedge pillow for nightly use due to chronic acid reflux, Disp: 1 each, Rfl: 0 .  Multiple Vitamin (MULTIVITAMIN) tablet, Take 1 tablet by mouth daily., Disp: , Rfl:  .  pantoprazole (PROTONIX) 40 MG tablet, Take 1 tablet by mouth once daily, Disp: 30 tablet, Rfl: 0 .  busPIRone (BUSPAR) 10 MG tablet, Take 1 tablet (10 mg total) by mouth 3 (three)  times daily. As needed for anxiety (Patient not taking: Reported on 04/11/2019), Disp: 90 tablet, Rfl: 0  EXAM:  VITALS per patient if applicable:  GENERAL: alert, oriented, appears well and in no acute distress  HEENT: atraumatic, conjunttiva clear, no obvious abnormalities on inspection of external nose and ears  NECK: normal movements of the head and neck  LUNGS: on inspection no signs of respiratory distress, breathing rate appears normal, no obvious gross SOB, gasping or wheezing  CV: no obvious cyanosis  MS: moves all visible extremities without noticeable abnormality  PSYCH/NEURO: pleasant and cooperative, no obvious depression or anxiety, speech and thought processing grossly intact  ASSESSMENT AND PLAN:  Discussed the following assessment and plan:  Essential hypertension - Plan: Comprehensive metabolic panel  Other hyperlipidemia - Plan: Lipid panel, TSH  Generalized anxiety disorder  Hypertension he reports compliance with medication regimen  But has not had BP checked in over a year. RN visit needed for BP check.  Lab Results  Component Value Date   CREATININE 1.14 01/05/2018   Lab Results  Component Value Date   NA 133 (L) 01/05/2018   K 4.1 01/05/2018   CL 97 01/05/2018   CO2 28 01/05/2018     Other hyperlipidemia LDL is elevated.  Given his age and lack of FH ,  No treatment was recommended.  Repeat assessment is due   Lab Results  Component Value Date   CHOL 270 (H) 01/05/2018   HDL 40.40 01/05/2018   LDLCALC 201 (H) 01/05/2018   LDLDIRECT 197.3 12/14/2012   TRIG 145.0 01/05/2018   CHOLHDL 7 01/05/2018     Generalized anxiety disorder He declines maintenance therapy or medications to abort panic attacks.  Recommended resuming regular exercise and continued use of melatonin for insomnia     I discussed the assessment and treatment plan with the patient. The patient was provided an opportunity to ask questions and all were answered. The  patient agreed with the plan and demonstrated an understanding of the instructions.   The patient was advised to call back or seek an in-person evaluation if the symptoms worsen or if the condition fails to improve as anticipated.   I provided  30 minutes of non-face-to-face time during this encounter reviewing patient's current problems and post surgeries.  Providing counseling on the above mentioned problems , and coordination  of care .  Gilbert Mc, MD

## 2019-04-11 NOTE — Patient Instructions (Signed)
CONTINUE METOPROLOL AND MELATONIN  [facility] WILL CALL YOU FOR FASTING LABS APPT AND RN VISIT

## 2019-04-11 NOTE — Assessment & Plan Note (Signed)
He declines maintenance therapy or medications to abort panic attacks.  Recommended resuming regular exercise and continued use of melatonin for insomnia

## 2019-04-13 ENCOUNTER — Ambulatory Visit: Payer: BC Managed Care – PPO | Admitting: Lab

## 2019-04-13 ENCOUNTER — Other Ambulatory Visit: Payer: Self-pay

## 2019-04-13 ENCOUNTER — Other Ambulatory Visit (INDEPENDENT_AMBULATORY_CARE_PROVIDER_SITE_OTHER): Payer: BC Managed Care – PPO

## 2019-04-13 VITALS — BP 132/82 | HR 62

## 2019-04-13 DIAGNOSIS — I10 Essential (primary) hypertension: Secondary | ICD-10-CM

## 2019-04-13 DIAGNOSIS — E7849 Other hyperlipidemia: Secondary | ICD-10-CM

## 2019-04-13 LAB — LIPID PANEL
Cholesterol: 263 mg/dL — ABNORMAL HIGH (ref 0–200)
HDL: 43.5 mg/dL (ref >39.00)
NonHDL: 219.39
Total CHOL/HDL Ratio: 6
Triglycerides: 223 mg/dL — ABNORMAL HIGH (ref 0.0–149.0)
VLDL: 44.6 mg/dL — ABNORMAL HIGH (ref 0.0–40.0)

## 2019-04-13 LAB — COMPREHENSIVE METABOLIC PANEL
ALT: 31 U/L (ref 0–53)
AST: 20 U/L (ref 0–37)
Albumin: 4.7 g/dL (ref 3.5–5.2)
Alkaline Phosphatase: 54 U/L (ref 39–117)
BUN: 14 mg/dL (ref 6–23)
CO2: 29 mEq/L (ref 19–32)
Calcium: 9.5 mg/dL (ref 8.4–10.5)
Chloride: 103 mEq/L (ref 96–112)
Creatinine, Ser: 1.05 mg/dL (ref 0.40–1.50)
GFR: 79.94 mL/min (ref >60.00)
Glucose, Bld: 95 mg/dL (ref 70–99)
Potassium: 4.2 mEq/L (ref 3.5–5.1)
Sodium: 138 mEq/L (ref 135–145)
Total Bilirubin: 0.5 mg/dL (ref 0.2–1.2)
Total Protein: 7.1 g/dL (ref 6.0–8.3)

## 2019-04-13 LAB — TSH: TSH: 2.75 u[IU]/mL (ref 0.35–4.50)

## 2019-04-13 LAB — LDL CHOLESTEROL, DIRECT: Direct LDL: 175 mg/dL

## 2019-04-13 NOTE — Progress Notes (Signed)
Pt in office today for BP check which was 132/82.

## 2019-05-08 ENCOUNTER — Other Ambulatory Visit: Payer: Self-pay | Admitting: Internal Medicine

## 2019-05-20 ENCOUNTER — Other Ambulatory Visit: Payer: Self-pay | Admitting: Internal Medicine

## 2019-05-22 ENCOUNTER — Telehealth: Payer: Self-pay | Admitting: Internal Medicine

## 2019-05-22 MED ORDER — PANTOPRAZOLE SODIUM 40 MG PO TBEC: 40.0000 mg | DELAYED_RELEASE_TABLET | Freq: Every day | ORAL | 3 refills | Status: DC

## 2019-05-22 MED ORDER — METOPROLOL SUCCINATE ER 25 MG PO TB24: 25.0000 mg | ORAL_TABLET | Freq: Every day | ORAL | 3 refills | Status: DC

## 2019-05-22 NOTE — Telephone Encounter (Signed)
Pt's wife called in and said she would like to know why husband's prescription for pantoprazole and metoprolol is only being filled for 30 days and not 90 days to [place]? She would like for these to be sent in as 90 days so they don't have to call us each month to get a new prescription. She would also like a call back to either her or her husband.

## 2019-05-22 NOTE — Telephone Encounter (Signed)
Medication has been refilled and pt's wife is aware that it has been refilled for 90 days with 3 refills. I explained to wife why the pt was getting only a 30 day supply, because he had not been seen since 2019.

## 2020-06-09 ENCOUNTER — Other Ambulatory Visit: Payer: Self-pay | Admitting: Internal Medicine

## 2020-07-28 ENCOUNTER — Other Ambulatory Visit: Payer: Self-pay

## 2020-07-28 ENCOUNTER — Encounter: Payer: Self-pay | Admitting: Emergency Medicine

## 2020-07-28 ENCOUNTER — Ambulatory Visit
Admission: EM | Admit: 2020-07-28 | Discharge: 2020-07-28 | Disposition: A | Discharge status: Home / Self Care | Payer: BC Managed Care – PPO | Attending: Physician Assistant | Admitting: Physician Assistant

## 2020-07-28 DIAGNOSIS — R52 Pain, unspecified: Secondary | ICD-10-CM

## 2020-07-28 DIAGNOSIS — R059 Cough, unspecified: Secondary | ICD-10-CM | POA: Insufficient documentation

## 2020-07-28 DIAGNOSIS — U071 COVID-19: Secondary | ICD-10-CM | POA: Diagnosis not present

## 2020-07-28 MED ORDER — PSEUDOEPH-BROMPHEN-DM 30-2-10 MG/5ML PO SYRP: 10.0000 mL | ORAL_SOLUTION | Freq: Four times a day (QID) | ORAL | 0 refills | Status: DC | PRN

## 2020-07-28 MED ORDER — IBUPROFEN 800 MG PO TABS: 800.0000 mg | ORAL_TABLET | Freq: Three times a day (TID) | ORAL | 0 refills | Status: AC | PRN

## 2020-07-28 NOTE — Discharge Instructions (Signed)

## 2020-07-28 NOTE — ED Triage Notes (Signed)
Patient c/o cough, sore throat, and congestion that started this morning.  Patient states that covid test at home came back positive.  Patient denies fevers.

## 2020-07-28 NOTE — ED Provider Notes (Signed)
[facility]    CSN: [id number] Arrival date & time: 07/28/20  1304      History   Chief Complaint Chief Complaint  Patient presents with  . Cough    COVID + home test    HPI Gilbert Cook is a 35 y.o. male presenting for onset of fatigue, body aches, cough, sore throat, nasal congestion, and headaches this morning.  Patient states that he took 2 at home COVID test which were both positive.  Patient has been vaccinated for COVID-19 with the [facility] vaccine but has not received any boosters.  No COVID exposure to his knowledge but states that his 86-month-old baby was sick last week with a cough and congestion and tested negative for COVID-19.  Patient has taken DayQuil for his cough but no other medications.  He is denying any difficulty breathing or vomiting.  Has had some mild diarrhea.  Past medical history significant for hypertension and hyperlipidemia.  No history of cardiopulmonary disease.  Patient says that he will need a work note.  No other concerns today.  HPI  Past Medical History:  Diagnosis Date  . Hypercholesteremia     Patient Active Problem List   Diagnosis Date Noted  . GERD without esophagitis 11/28/2017  . Other hyperlipidemia 06/16/2016  . Hypertension 05/23/2016  . Palpitations 04/28/2016  . Generalized anxiety disorder 03/18/2013  . Encounter for preventive health examination 12/17/2012    Past Surgical History:  Procedure Laterality Date  . CHOLECYSTECTOMY N/A 06/06/2015   Procedure: LAPAROSCOPIC CHOLECYSTECTOMY WITH INTRAOPERATIVE CHOLANGIOGRAM;  Surgeon: Jules Husbands, MD;  Location: [facility] ORS;  Service: General;  Laterality: N/A;  . FRACTURE SURGERY Right 2013   wrist (Screw)       Home Medications    Prior to Admission medications   Medication Sig Start Date End Date Taking? Authorizing Provider  aspirin EC 81 MG tablet Take 1 tablet (81 mg total) by mouth daily. 06/16/16  Yes Wende Bushy, MD   brompheniramine-pseudoephedrine-DM 30-2-10 MG/5ML syrup Take 10 mLs by mouth 4 (four) times daily as needed for up to 7 days. 07/28/20 08/04/20 Yes Danton Clap, PA-C  ibuprofen (ADVIL) 800 MG tablet Take 1 tablet (800 mg total) by mouth every 8 (eight) hours as needed for fever, headache or moderate pain. 07/28/20  Yes Laurene Footman B, PA-C  metoprolol succinate (TOPROL-XL) 25 MG 24 hr tablet Take 1 tablet by mouth once daily 06/10/20  Yes Crecencio Mc, MD  Multiple Vitamin (MULTIVITAMIN) tablet Take 1 tablet by mouth daily.   Yes [provider]  pantoprazole (PROTONIX) 40 MG tablet Take 1 tablet by mouth once daily 06/10/20  Yes Crecencio Mc, MD  busPIRone (BUSPAR) 10 MG tablet Take 1 tablet (10 mg total) by mouth 3 (three) times daily. As needed for anxiety Patient not taking: No sig reported 01/05/18   Crecencio Mc, MD  famotidine (PEPCID) 20 MG tablet Take 1 tablet (20 mg total) by mouth at bedtime. 01/05/18   Crecencio Mc, MD  Misc. Devices MISC 1 wedge pillow for nightly use due to chronic acid reflux 01/27/17   Virgel Manifold, MD    Family History Family History  Problem Relation Age of Onset  . Hypertension Mother   . Heart disease Mother   . Hypertension Father   . Cancer Father 50       T cell leukemia  . Hypertension Maternal Grandmother   . Hypertension Maternal Grandfather   .  Heart disease Maternal Grandfather   . Heart attack Maternal Grandfather   . Hypertension Paternal Grandmother   . Hypertension Paternal Grandfather   . Depression Maternal Uncle   . Schizophrenia Maternal Uncle     Social History Social History   Tobacco Use  . Smoking status: Former Smoker    Packs/day: 0.50    Years: 5.00    Pack years: 2.50    Types: Cigarettes    Quit date: 12/15/2010    Years since quitting: 9.6  . Smokeless tobacco: Never Used  Vaping Use  . Vaping Use: Never used  Substance Use Topics  . Alcohol use: Yes  . Drug use: Yes    Types:  Marijuana     Allergies   Patient has no known allergies.   Review of Systems Review of Systems  Constitutional: Positive for fatigue. Negative for fever.  HENT: Positive for congestion, rhinorrhea and sore throat. Negative for sinus pressure and sinus pain.   Respiratory: Positive for cough. Negative for shortness of breath.   Gastrointestinal: Positive for diarrhea. Negative for abdominal pain, nausea and vomiting.  Musculoskeletal: Positive for myalgias.  Neurological: Positive for headaches. Negative for weakness and light-headedness.  Hematological: Negative for adenopathy.     Physical Exam Triage Vital Signs ED Triage Vitals  Enc Vitals Group     BP 07/28/20 1316 (!) 148/97     Pulse Rate 07/28/20 1316 96     Resp 07/28/20 1316 16     Temp 07/28/20 1316 99.7 F (37.6 C)     Temp Source 07/28/20 1316 Oral     SpO2 07/28/20 1316 100 %     Weight 07/28/20 1313 220 lb (99.8 kg)     Height 07/28/20 1313 6' (1.829 m)     Head Circumference --      Peak Flow --      Pain Score 07/28/20 1313 7     Pain Loc --      Pain Edu? --      Excl. in [place]? --    No data found.  Updated Vital Signs BP (!) 148/97 (BP Location: Left Arm)   Pulse 96   Temp 99.7 F (37.6 C) (Oral)   Resp 16   Ht 6' (1.829 m)   Wt 220 lb (99.8 kg)   SpO2 100%   BMI 29.84 kg/m        Physical Exam Vitals and nursing note reviewed.  Constitutional:      General: He is not in acute distress.    Appearance: Normal appearance. He is well-developed. He is not ill-appearing or diaphoretic.  HENT:     Head: Normocephalic and atraumatic.     Nose: Congestion and rhinorrhea present.     Mouth/Throat:     Mouth: Mucous membranes are moist.     Pharynx: Oropharynx is clear. Uvula midline. No oropharyngeal exudate.     Tonsils: No tonsillar abscesses.  Eyes:     General: No scleral icterus.       Right eye: No discharge.        Left eye: No discharge.     Conjunctiva/sclera: Conjunctivae  normal.  Neck:     Thyroid: No thyromegaly.     Trachea: No tracheal deviation.  Cardiovascular:     Rate and Rhythm: Normal rate and regular rhythm.     Heart sounds: Normal heart sounds.  Pulmonary:     Effort: Pulmonary effort is normal. No respiratory distress.  Breath sounds: Normal breath sounds. No wheezing, rhonchi or rales.  Musculoskeletal:     Cervical back: Neck supple.  Skin:    General: Skin is dry.  Neurological:     General: No focal deficit present.     Mental Status: He is alert. Mental status is at baseline.     Motor: No weakness.     Gait: Gait normal.  Psychiatric:        Mood and Affect: Mood normal.        Behavior: Behavior normal.        Thought Content: Thought content normal.      UC Treatments / Results  Labs (all labs ordered are listed, but only abnormal results are displayed) Labs Reviewed  SARS CORONAVIRUS 2 (TAT 6-24 HRS)    EKG   Radiology No results found.  Procedures Procedures (including critical care time)  Medications Ordered in UC Medications - No data to display  Initial Impression / Assessment and Plan / UC Course  I have reviewed the triage vital signs and the nursing notes.  Pertinent labs & imaging results that were available during my care of the patient were reviewed by me and considered in my medical decision making (see chart for details).   35 year old male presenting with fatigue, body aches, cough, congestion and sore throat and 2 positive at home COVID test.  Patient is presently afebrile.  Temperature is 99.7 degrees.  Oxygen saturations 100%.  He is overall well-appearing.  Exam significant for mild nasal congestion and clear rhinorrhea.  His chest is clear to auscultation heart regular rate and rhythm.  COVID test obtained.  Current CDC guidelines, isolation protocol and ED precautions reviewed patient.  Advised him that if he did have 2 definite positive test at home that he likely will test positive  on a PCR test as well.  Have given him a work note.  Supportive care encouraged with increasing rest and fluids and taking over-the-counter ibuprofen and/or Tylenol as needed for achiness or fevers.  I sent in 800 mg ibuprofen and Bromfed-DM.  Advised him to follow-up with our clinic as needed.  If patient does test positive on PCR test he may be a candidate for antibody or antiviral medications.   Final Clinical Impressions(s) / UC Diagnoses   Final diagnoses:  COVID-19  Cough  Body aches     Discharge Instructions     You have received COVID testing today either for positive exposure, concerning symptoms that could be related to COVID infection, screening purposes, or re-testing after confirmed positive.  Your test obtained today checks for active viral infection in the last 1-2 weeks. If your test is negative now, you can still test positive later. So, if you do develop symptoms you should either get re-tested and/or isolate x 5 days and then strict mask use x 5 days (unvaccinated) or mask use x 10 days (vaccinated). Please follow CDC guidelines.  While Rapid antigen tests come back in 15-20 minutes, send out PCR/molecular test results typically come back within 1-3 days. In the mean time, if you are symptomatic, assume this could be a positive test and treat/monitor yourself as if you do have COVID.   We will call with test results if positive. Please download the MyChart app and set up a profile to access test results.   If symptomatic, go home and rest. Push fluids. Take Tylenol as needed for discomfort. Gargle warm salt water. Throat lozenges. Take Mucinex DM or Robitussin for  cough. Humidifier in bedroom to ease coughing. Warm showers. Also review the COVID handout for more information.  COVID-19 INFECTION: The incubation period of COVID-19 is approximately 14 days after exposure, with most symptoms developing in roughly 4-5 days. Symptoms may range in severity from mild to  critically severe. Roughly 80% of those infected will have mild symptoms. People of any age may become infected with COVID-19 and have the ability to transmit the virus. The most common symptoms include: fever, fatigue, cough, body aches, headaches, sore throat, nasal congestion, shortness of breath, nausea, vomiting, diarrhea, changes in smell and/or taste.    COURSE OF ILLNESS Some patients may begin with mild disease which can progress quickly into critical symptoms. If your symptoms are worsening please call ahead to the Emergency Department and proceed there for further treatment. Recovery time appears to be roughly 1-2 weeks for mild symptoms and 3-6 weeks for severe disease.   GO IMMEDIATELY TO ER FOR FEVER YOU ARE UNABLE TO GET DOWN WITH TYLENOL, BREATHING PROBLEMS, CHEST PAIN, FATIGUE, LETHARGY, INABILITY TO EAT OR DRINK, ETC  QUARANTINE AND ISOLATION: To help decrease the spread of COVID-19 please remain isolated if you have COVID infection or are highly suspected to have COVID infection. This means -stay home and isolate to one room in the home if you live with others. Do not share a bed or bathroom with others while ill, sanitize and wipe down all countertops and keep common areas clean and disinfected. Stay home for 5 days. If you have no symptoms or your symptoms are resolving after 5 days, you can leave your house. Continue to wear a mask around others for 5 additional days. If you have been in close contact (within 6 feet) of someone diagnosed with COVID 19, you are advised to quarantine in your home for 14 days as symptoms can develop anywhere from 2-14 days after exposure to the virus. If you develop symptoms, you  must isolate.  Most current guidelines for COVID after exposure -unvaccinated: isolate 5 days and strict mask use x 5 days. Test on day 5 is possible -vaccinated: wear mask x 10 days if symptoms do not develop -You do not necessarily need to be tested for COVID if you have +  exposure and  develop symptoms. Just isolate at home x10 days from symptom onset During this global pandemic, CDC advises to practice social distancing, try to stay at least 73ft away from others at all times. Wear a face covering. Wash and sanitize your hands regularly and avoid going anywhere that is not necessary.  KEEP IN MIND THAT THE COVID TEST IS NOT 100% ACCURATE AND YOU SHOULD STILL DO EVERYTHING TO PREVENT POTENTIAL SPREAD OF VIRUS TO OTHERS (WEAR MASK, WEAR GLOVES, [place] HANDS AND SANITIZE REGULARLY). IF INITIAL TEST IS NEGATIVE, THIS MAY NOT MEAN YOU ARE DEFINITELY NEGATIVE. MOST ACCURATE TESTING IS DONE 5-7 DAYS AFTER EXPOSURE.   It is not advised by CDC to get re-tested after receiving a positive COVID test since you can still test positive for weeks to months after you have already cleared the virus.   *If you have not been vaccinated for COVID, I strongly suggest you consider getting vaccinated as long as there are no contraindications.      ED Prescriptions    Medication Sig Dispense Auth. Provider   brompheniramine-pseudoephedrine-DM 30-2-10 MG/5ML syrup Take 10 mLs by mouth 4 (four) times daily as needed for up to 7 days. 150 mL Danton Clap, PA-C  ibuprofen (ADVIL) 800 MG tablet Take 1 tablet (800 mg total) by mouth every 8 (eight) hours as needed for fever, headache or moderate pain. 21 tablet Danton Clap, PA-C     PDMP not reviewed this encounter.   Danton Clap, PA-C 07/28/20 1401

## 2020-07-29 LAB — SARS CORONAVIRUS 2 (TAT 6-24 HRS): SARS Coronavirus 2: POSITIVE — AB

## 2020-07-30 ENCOUNTER — Telehealth: Payer: Self-pay

## 2020-07-30 NOTE — Telephone Encounter (Signed)
Called to discuss with patient about COVID-19 symptoms and the use of one of the available treatments for those with mild to moderate Covid symptoms and at a high risk of hospitalization.  Pt appears to qualify for outpatient treatment due to co-morbid conditions and/or a member of an at-risk group in accordance with the FDA Emergency Use Authorization.    Symptom onset: 07/28/20 cough,body aches,congestion Vaccinated: J J Booster? No Immunocompromised? No Qualifiers: HTN NIH Criteria: Tier 4 Declines further treatment.   Gilbert Cook

## 2020-08-02 ENCOUNTER — Telehealth: Payer: Self-pay | Admitting: Internal Medicine

## 2020-08-02 MED ORDER — PSEUDOEPH-BROMPHEN-DM 30-2-10 MG/5ML PO SYRP: 10.0000 mL | ORAL_SOLUTION | Freq: Four times a day (QID) | ORAL | 0 refills | Status: AC | PRN

## 2020-08-02 NOTE — Telephone Encounter (Signed)
Cough medication refilled.  If the Urgent Care diagnosed him with COVID and did not prescribe Paxlovid,  They must have  felt that his symptoms were too mild to warrant it.  It is NOT a benign drug so we do not prescribe it for mild bronchitis type symptoms

## 2020-08-02 NOTE — Telephone Encounter (Signed)
Pt's wife wanted to inquire about the other anti viral that is more like tamiflu called Molnupiravir because that is what she was prescribed by her doctor and she has seen an improvement in her symptoms.

## 2020-08-02 NOTE — Telephone Encounter (Signed)
Spoke with pt to let him know that Dr. Derrel Nip is not comfortable prescribing the anti viral medication being that she did not see him initially. Pt gave a verbal understanding.

## 2020-08-02 NOTE — Telephone Encounter (Signed)
My answer is still the same

## 2020-08-02 NOTE — Telephone Encounter (Signed)
Caller called in to access nurse c/o of Josiel having Fever x3days, fatigue, and cough. Access nurse recommends PCP Triage within 4 hours.

## 2020-08-02 NOTE — Telephone Encounter (Signed)
Patient's spouse called and stated patient test positive for covid on 07/28/20. Patient was seen at [facility]. Spouse stated husband's symptoms are getting worse. Symptoms are cough, fatigue, head congestion, no fever. Spouse was transferred to [facility] at [facility].

## 2020-08-02 NOTE — Telephone Encounter (Signed)
Access Nurse called with the wife Gilbert Cook) on the line. She said that she was aware of our office not having any appts avail. The wife states that she wants the husband to be prescribed the antiviral medication paxlovid. I offered an appt with Chalmers Cater, FNP on 08/05/20 at 4:30. She states that he needs the medication this weekend and that if we couldn't do that, could we "at least send in a refill of the cough syrup that was prescribed to him by the UC"? Please advise.

## 2020-09-03 ENCOUNTER — Other Ambulatory Visit (HOSPITAL_COMMUNITY): Payer: Self-pay | Admitting: Orthopedic Surgery

## 2020-09-03 ENCOUNTER — Other Ambulatory Visit: Payer: Self-pay | Admitting: Orthopedic Surgery

## 2020-09-03 DIAGNOSIS — M5412 Radiculopathy, cervical region: Secondary | ICD-10-CM

## 2020-09-10 ENCOUNTER — Ambulatory Visit: Admission: RE | Admit: 2020-09-10 | Payer: BC Managed Care – PPO | Source: Ambulatory Visit

## 2020-09-24 ENCOUNTER — Other Ambulatory Visit: Payer: Self-pay | Admitting: Internal Medicine

## 2020-09-24 ENCOUNTER — Other Ambulatory Visit: Payer: Self-pay | Admitting: Family Medicine

## 2020-09-24 DIAGNOSIS — R937 Abnormal findings on diagnostic imaging of other parts of musculoskeletal system: Secondary | ICD-10-CM

## 2020-10-15 ENCOUNTER — Ambulatory Visit: Payer: BC Managed Care – PPO

## 2020-11-17 ENCOUNTER — Ambulatory Visit
Admission: EM | Admit: 2020-11-17 | Discharge: 2020-11-17 | Disposition: A | Discharge status: Home / Self Care | Payer: BC Managed Care – PPO | Attending: Medical Oncology | Admitting: Medical Oncology

## 2020-11-17 DIAGNOSIS — R059 Cough, unspecified: Secondary | ICD-10-CM | POA: Diagnosis present

## 2020-11-17 DIAGNOSIS — H66003 Acute suppurative otitis media without spontaneous rupture of ear drum, bilateral: Secondary | ICD-10-CM | POA: Insufficient documentation

## 2020-11-17 DIAGNOSIS — J029 Acute pharyngitis, unspecified: Secondary | ICD-10-CM | POA: Insufficient documentation

## 2020-11-17 LAB — POCT RAPID STREP A: Streptococcus, Group A Screen (Direct): NEGATIVE

## 2020-11-17 MED ORDER — AMOXICILLIN-POT CLAVULANATE 875-125 MG PO TABS: 1.0000 | ORAL_TABLET | Freq: Two times a day (BID) | ORAL | 0 refills | Status: AC

## 2020-11-17 MED ORDER — BENZONATATE 100 MG PO CAPS: 100.0000 mg | ORAL_CAPSULE | Freq: Three times a day (TID) | ORAL | 0 refills | Status: DC

## 2020-11-17 MED ORDER — FLUTICASONE PROPIONATE 50 MCG/ACT NA SUSP: 2.0000 | Freq: Every day | NASAL | 0 refills | Status: AC

## 2020-11-17 NOTE — ED Triage Notes (Signed)
Pt here with C/O ear clogged, and sore throat. Was feverish and body aches on Wednesday.

## 2020-11-17 NOTE — ED Provider Notes (Addendum)
[facility]    CSN: [id number] Arrival date & time: 11/17/20  1009      History   Chief Complaint Cold Symptoms:    HPI MELVILLE LIONETTI is a 35 y.o. male.   HPI  Cold Symptoms: Pt reports that for the past 5 days he has had ear congestion, sore throat, body aches and has been feeling feverish. Started after flying back from [place]. He has tried tylenol for symptoms with little relief. He denies known fever, SOB, chest pain, vomiting, rash. No known sick contacts. Has had 2 negative home COVID-19 tests.    Past Medical History:  Diagnosis Date   Hypercholesteremia     Patient Active Problem List   Diagnosis Date Noted   GERD without esophagitis 11/28/2017   Other hyperlipidemia 06/16/2016   Hypertension 05/23/2016   Palpitations 04/28/2016   Generalized anxiety disorder 03/18/2013   Encounter for preventive health examination 12/17/2012    Past Surgical History:  Procedure Laterality Date   CHOLECYSTECTOMY N/A 06/06/2015   Procedure: LAPAROSCOPIC CHOLECYSTECTOMY WITH INTRAOPERATIVE CHOLANGIOGRAM;  Surgeon: Jules Husbands, MD;  Location: [facility] OR[facility];  Service: General;  Laterality: N/A;   FRACTURE SURGERY Right 2013   wrist (Screw)       Home Medications    Prior to Admission medications   Medication Sig Start Date End Date Taking? Authorizing Provider  gabapentin (NEURONTIN) 300 MG capsule 1 po qHS x 4 days, then bid 10/21/20  Yes [provider]  metoprolol succinate (TOPROL-XL) 25 MG 24 hr tablet Take 1 tablet by mouth once daily 09/24/20  Yes Crecencio Mc, MD  Multiple Vitamin (MULTIVITAMIN) tablet Take 1 tablet by mouth daily.   Yes [provider]  pantoprazole (PROTONIX) 40 MG tablet Take 1 tablet by mouth once daily 06/10/20  Yes Crecencio Mc, MD  aspirin EC 81 MG tablet Take 1 tablet (81 mg total) by mouth daily. 06/16/16   Wende Bushy, MD  busPIRone (BUSPAR) 10 MG tablet Take 1 tablet (10 mg total) by mouth 3 (three) times  daily. As needed for anxiety Patient not taking: No sig reported 01/05/18   Crecencio Mc, MD  famotidine (PEPCID) 20 MG tablet Take 1 tablet (20 mg total) by mouth at bedtime. 01/05/18   Crecencio Mc, MD  ibuprofen (ADVIL) 800 MG tablet Take 1 tablet (800 mg total) by mouth every 8 (eight) hours as needed for fever, headache or moderate pain. 07/28/20   Danton Clap, PA-C  Misc. Devices MISC 1 wedge pillow for nightly use due to chronic acid reflux 01/27/17   Virgel Manifold, MD    Family History Family History  Problem Relation Age of Onset   Hypertension Mother    Heart disease Mother    Hypertension Father    Cancer Father 50       T cell leukemia   Hypertension Maternal Grandmother    Hypertension Maternal Grandfather    Heart disease Maternal Grandfather    Heart attack Maternal Grandfather    Hypertension Paternal Grandmother    Hypertension Paternal Grandfather    Depression Maternal Uncle    Schizophrenia Maternal Uncle     Social History Social History   Tobacco Use   Smoking status: Former    Packs/day: 0.50    Years: 5.00    Pack years: 2.50    Types: Cigarettes    Quit date: 12/15/2010    Years since quitting: 9.9   Smokeless tobacco: Never  Vaping  Use   Vaping Use: Never used  Substance Use Topics   Alcohol use: Yes   Drug use: Yes    Types: Marijuana     Allergies   Patient has no known allergies.   Review of Systems Review of Systems  As stated above in HPI Physical Exam Triage Vital Signs ED Triage Vitals  Enc Vitals Group     BP 11/17/20 1025 131/90     Pulse Rate 11/17/20 1025 75     Resp 11/17/20 1025 18     Temp 11/17/20 1025 98.4 F (36.9 C)     Temp src --      SpO2 11/17/20 1025 100 %     Weight --      Height --      Head Circumference --      Peak Flow --      Pain Score 11/17/20 1022 10     Pain Loc --      Pain Edu? --      Excl. in [place]? --    No data found.  Updated Vital Signs BP 131/90 (BP Location:  Left Arm)   Pulse 75   Temp 98.4 F (36.9 C)   Resp 18   SpO2 100%   Physical Exam Vitals and nursing note reviewed.  Constitutional:      General: He is not in acute distress.    Appearance: Normal appearance. He is not ill-appearing, toxic-appearing or diaphoretic.  HENT:     Head: Normocephalic and atraumatic.     Right Ear: Hearing normal. Tympanic membrane is erythematous and bulging.     Left Ear: Hearing normal. Tympanic membrane is erythematous and bulging.     Nose: Congestion and rhinorrhea present.     Mouth/Throat:     Mouth: Mucous membranes are moist.     Pharynx: No oropharyngeal exudate or posterior oropharyngeal erythema.  Eyes:     Extraocular Movements: Extraocular movements intact.     Pupils: Pupils are equal, round, and reactive to light.  Cardiovascular:     Rate and Rhythm: Normal rate and regular rhythm.     Heart sounds: Normal heart sounds.  Pulmonary:     Effort: Pulmonary effort is normal.     Breath sounds: Normal breath sounds.  Musculoskeletal:     Cervical back: Normal range of motion and neck supple.  Lymphadenopathy:     Cervical: Cervical adenopathy present.  Skin:    General: Skin is warm.  Neurological:     Mental Status: He is alert and oriented to person, place, and time.     UC Treatments / Results  Labs (all labs ordered are listed, but only abnormal results are displayed) Labs Reviewed - No data to display  EKG   Radiology No results found.  Procedures Procedures (including critical care time)  Medications Ordered in UC Medications - No data to display  Initial Impression / Assessment and Plan / UC Course  I have reviewed the triage vital signs and the nursing notes.  Pertinent labs & imaging results that were available during my care of the patient were reviewed by me and considered in my medical decision making (see chart for details).     New. Treating with flonase, augmentin, tessalon to prevent further  complications. Discussed with patient along with how to taken and red flag signs and symptoms. Follow up PRN.  Final Clinical Impressions(s) / UC Diagnoses   Final diagnoses:  None   Discharge Instructions  None    ED Prescriptions   None    PDMP not reviewed this encounter.   Hughie Closs, PA-C 11/17/20 [place], [place] 11/17/20 1046

## 2020-11-18 ENCOUNTER — Ambulatory Visit: Payer: BC Managed Care – PPO | Admitting: Dermatology

## 2020-11-18 ENCOUNTER — Other Ambulatory Visit: Payer: Self-pay

## 2020-11-18 DIAGNOSIS — L720 Epidermal cyst: Secondary | ICD-10-CM | POA: Diagnosis not present

## 2020-11-18 DIAGNOSIS — L578 Other skin changes due to chronic exposure to nonionizing radiation: Secondary | ICD-10-CM

## 2020-11-18 DIAGNOSIS — D2239 Melanocytic nevi of other parts of face: Secondary | ICD-10-CM

## 2020-11-18 DIAGNOSIS — D229 Melanocytic nevi, unspecified: Secondary | ICD-10-CM

## 2020-11-18 DIAGNOSIS — Z1283 Encounter for screening for malignant neoplasm of skin: Secondary | ICD-10-CM

## 2020-11-18 DIAGNOSIS — D1800 Hemangioma unspecified site: Secondary | ICD-10-CM

## 2020-11-18 DIAGNOSIS — D489 Neoplasm of uncertain behavior, unspecified: Secondary | ICD-10-CM

## 2020-11-18 DIAGNOSIS — I781 Nevus, non-neoplastic: Secondary | ICD-10-CM

## 2020-11-18 DIAGNOSIS — L814 Other melanin hyperpigmentation: Secondary | ICD-10-CM

## 2020-11-18 DIAGNOSIS — L820 Inflamed seborrheic keratosis: Secondary | ICD-10-CM

## 2020-11-18 DIAGNOSIS — L821 Other seborrheic keratosis: Secondary | ICD-10-CM

## 2020-11-18 NOTE — Progress Notes (Signed)
New Patient Visit  Subjective  Gilbert Cook is a 35 y.o. male who presents for the following: New Patient (Initial Visit) (Patient here today for tbse. He reports a few spots of concern patient reports a spot at back, 1 at right hand, and nose . Patient denies history or family history of skin cancer. ).  Patient here for full body skin exam and skin cancer screening.  The following portions of the chart were reviewed this encounter and updated as appropriate:   Tobacco  Allergies  Meds  Problems  Med Hx  Surg Hx  Fam Hx     Review of Systems:  No other skin or systemic complaints except as noted in HPI or Assessment and Plan.  Objective  Well appearing patient in no apparent distress; mood and affect are within normal limits.  A full examination was performed including scalp, head, eyes, ears, nose, lips, neck, chest, axillae, abdomen, back, buttocks, bilateral upper extremities, bilateral lower extremities, hands, feet, fingers, toes, fingernails, and toenails. All findings within normal limits unless otherwise noted below.  right cheek Smooth white papule(s).   right medial cheek 0.6 cm flesh colored papule      Right Dorsal Hand x 1 Erythematous keratotic or waxy stuck-on papule or plaque.    Assessment & Plan  Milia right cheek; left cheek Chronic and persistent  benign, observe.  Discussed treatment options with topical retinoids versus extraction.  Patient declines treatment at this time.  Neoplasm of uncertain behavior right medial cheek Epidermal / dermal shaving  Lesion diameter (cm):  0.6 Informed consent: discussed and consent obtained   Timeout: patient name, date of birth, surgical site, and procedure verified   Procedure prep:  Patient was prepped and draped in usual sterile fashion Prep type:  Isopropyl alcohol Anesthesia: the lesion was anesthetized in a standard fashion   Anesthetic:  1% lidocaine w/ epinephrine 1-100,000 buffered w/  8.4% NaHCO3 Instrument used: flexible razor blade   Hemostasis achieved with: pressure, aluminum chloride and electrodesiccation   Outcome: patient tolerated procedure well   Post-procedure details: sterile dressing applied and wound care instructions given   Dressing type: bandage and petrolatum    Specimen 1 - Surgical pathology Differential Diagnosis: Irritated nevus r/o dysplasia vs irritated oil gland   Check Margins: No  Irritated nevus r/o dysplasia   Inflamed seborrheic keratosis Right Dorsal Hand x 1  Destruction of lesion - Right Dorsal Hand x 1 Complexity: simple   Destruction method: cryotherapy   Informed consent: discussed and consent obtained   Timeout:  patient name, date of birth, surgical site, and procedure verified Lesion destroyed using liquid nitrogen: Yes   Region frozen until ice ball extended beyond lesion: Yes   Outcome: patient tolerated procedure well with no complications   Post-procedure details: wound care instructions given    Lentigines - Scattered tan macules - Due to sun exposure - Benign-appearing, observe - Recommend daily broad spectrum sunscreen SPF 30+ to sun-exposed areas, reapply every 2 hours as needed. - Call for any changes  Telangiectasia - Dilated blood vessel at left nose and back - Benign appearing on exam - Call for changes  Seborrheic Keratoses - Stuck-on, waxy, tan-brown papules and/or plaques at right dorsum hand  - Benign-appearing - Discussed benign etiology and prognosis. - Observe - Call for any changes  Melanocytic Nevi - Tan-brown and/or pink-flesh-colored symmetric macules and papules  - Benign appearing on exam today - Observation - Call clinic for new or changing  moles - Recommend daily use of broad spectrum spf 30+ sunscreen to sun-exposed areas.   Hemangiomas - Red papules - Discussed benign nature - Observe - Call for any changes  Actinic Damage - Chronic condition, secondary to cumulative  UV/sun exposure - diffuse scaly erythematous macules with underlying dyspigmentation - Recommend daily broad spectrum sunscreen SPF 30+ to sun-exposed areas, reapply every 2 hours as needed.  - Staying in the shade or wearing long sleeves, sun glasses (UVA+UVB protection) and wide brim hats (4-inch brim around the entire circumference of the hat) are also recommended for sun protection.  - Call for new or changing lesions.  Skin cancer screening performed today.  Return if symptoms worsen or fail to improve.  IRuthell Rummage, CMA, am acting as scribe for Sarina Ser, MD. Documentation: I have reviewed the above documentation for accuracy and completeness, and I agree with the above.  Sarina Ser, MD

## 2020-11-18 NOTE — Patient Instructions (Addendum)
Biopsy Wound Care Instructions  Leave the original bandage on for 24 hours if possible.  If the bandage becomes soaked or soiled before that time, it is OK to remove it and examine the wound.  A small amount of post-operative bleeding is normal.  If excessive bleeding occurs, remove the bandage, place gauze over the site and apply continuous pressure (no peeking) over the area for 30 minutes. If this does not work, please call our clinic as soon as possible or page your doctor if it is after hours.   Once a day, cleanse the wound with soap and water. It is fine to shower. If a thick crust develops you may use a Q-tip dipped into dilute hydrogen peroxide (mix 1:1 with water) to dissolve it.  Hydrogen peroxide can slow the healing process, so use it only as needed.    After washing, apply petroleum jelly (Vaseline) or an antibiotic ointment if your doctor prescribed one for you, followed by a bandage.    For best healing, the wound should be covered with a layer of ointment at all times. If you are not able to keep the area covered with a bandage to hold the ointment in place, this may mean re-applying the ointment several times a day.  Continue this wound care until the wound has healed and is no longer open.   Itching and mild discomfort is normal during the healing process. However, if you develop pain or severe itching, please call our office.   If you have any discomfort, you can take Tylenol (acetaminophen) or ibuprofen as directed on the bottle. (Please do not take these if you have an allergy to them or cannot take them for another reason).  Some redness, tenderness and white or yellow material in the wound is normal healing.  If the area becomes very sore and red, or develops a thick yellow-green material (pus), it may be infected; please notify us.    If you have stitches, return to clinic as directed to have the stitches removed. You will continue wound care for 2-3 days after the stitches  are removed.   Wound healing continues for up to one year following surgery. It is not unusual to experience pain in the scar from time to time during the interval.  If the pain becomes severe or the scar thickens, you should notify the office.    A slight amount of redness in a scar is expected for the first six months.  After six months, the redness will fade and the scar will soften and fade.  The color difference becomes less noticeable with time.  If there are any problems, return for a post-op surgery check at your earliest convenience.  To improve the appearance of the scar, you can use silicone scar gel, cream, or sheets (such as Mederma or Serica) every night for up to one year. These are available over the counter (without a prescription).  Please call our office at [phone] for any questions or concerns.    Seborrheic Keratosis  What causes seborrheic keratoses? Seborrheic keratoses are harmless, common skin growths that first appear during adult life.  As time goes by, more growths appear.  Some people may develop a large number of them.  Seborrheic keratoses appear on both covered and uncovered body parts.  They are not caused by sunlight.  The tendency to develop seborrheic keratoses can be inherited.  They vary in color from skin-colored to gray, brown, or even black.  They can  be either smooth or have a rough, warty surface.   Seborrheic keratoses are superficial and look as if they were stuck on the skin.  Under the microscope this type of keratosis looks like layers upon layers of skin.  That is why at times the top layer may seem to fall off, but the rest of the growth remains and re-grows.    Treatment Seborrheic keratoses do not need to be treated, but can easily be removed in the office.  Seborrheic keratoses often cause symptoms when they rub on clothing or jewelry.  Lesions can be in the way of shaving.  If they become inflamed, they can cause itching, soreness, or  burning.  Removal of a seborrheic keratosis can be accomplished by freezing, burning, or surgery. If any spot bleeds, scabs, or grows rapidly, please return to have it checked, as these can be an indication of a skin cancer.   Melanoma ABCDEs  Melanoma is the most dangerous type of skin cancer, and is the leading cause of death from skin disease.  You are more likely to develop melanoma if you: Have light-colored skin, light-colored eyes, or red or blond hair Spend a lot of time in the sun Tan regularly, either outdoors or in a tanning bed Have had blistering sunburns, especially during childhood Have a close family member who has had a melanoma Have atypical moles or large birthmarks  Early detection of melanoma is key since treatment is typically straightforward and cure rates are extremely high if we catch it early.   The first sign of melanoma is often a change in a mole or a new dark spot.  The ABCDE system is a way of remembering the signs of melanoma.  A for asymmetry:  The two halves do not match. B for border:  The edges of the growth are irregular. C for color:  A mixture of colors are present instead of an even brown color. D for diameter:  Melanomas are usually (but not always) greater than 41m - the size of a pencil eraser. E for evolution:  The spot keeps changing in size, shape, and color.  Please check your skin once per month between visits. You can use a small mirror in front and a large mirror behind you to keep an eye on the back side or your body.   If you see any new or changing lesions before your next follow-up, please call to schedule a visit.  Please continue daily skin protection including broad spectrum sunscreen SPF 30+ to sun-exposed areas, reapplying every 2 hours as needed when you're outdoors.   Staying in the shade or wearing long sleeves, sun glasses (UVA+UVB protection) and wide brim hats (4-inch brim around the entire circumference of the hat) are  also recommended for sun protection.    If you have any questions or concerns for your doctor, please call our main line at [phone] press option 4 to reach your doctor's medical assistant. If no one answers, please leave a voicemail as directed and we will return your call as soon as possible. Messages left after 4 pm will be answered the following business day.   You may also send [place] message via [place] We typically respond to MyChart messages within 1-2 business days.  For prescription refills, please ask your pharmacy to contact our office. Our fax number is [phone]  If you have an urgent issue when the clinic is closed that cannot wait until the next business day, you  can page your doctor at the number below.    Please note that while we do our best to be available for urgent issues outside of office hours, we are not available 24/7.   If you have an urgent issue and are unable to reach [place], you may choose to seek medical care at your doctor's office, retail clinic, urgent care center, or emergency room.  If you have a medical emergency, please immediately call 911 or go to the emergency department.  Pager Numbers  - Dr. Nehemiah Massed: [phone]  - Dr. Laurence Ferrari: [phone]  - Dr. Nicole Kindred: [phone]  In the event of inclement weather, please call our main line at [phone] for an update on the status of any delays or closures.  Dermatology Medication Tips: Please keep the boxes that topical medications come in in order to help keep track of the instructions about where and how to use these. Pharmacies typically print the medication instructions only on the boxes and not directly on the medication tubes.   If your medication is too expensive, please contact our office at [phone] option 4 or send [place] a message through [place].   We are unable to tell what your co-pay for medications will be in advance as this is different depending on your insurance coverage.  However, we may be able to find a substitute medication at lower cost or fill out paperwork to get insurance to cover a needed medication.   If a prior authorization is required to get your medication covered by your insurance company, please allow [place] 1-2 business days to complete this process.  Drug prices often vary depending on where the prescription is filled and some pharmacies may offer cheaper prices.  The website www.goodrx.com contains coupons for medications through different pharmacies. The prices here do not account for what the cost may be with help from insurance (it may be cheaper with your insurance), but the website can give you the price if you did not use any insurance.  - You can print the associated coupon and take it with your prescription to the pharmacy.  - You may also stop by our office during regular business hours and pick up a GoodRx coupon card.  - If you need your prescription sent electronically to a different pharmacy, notify our office through [facility] or by phone at [phone] option 4.

## 2020-11-19 ENCOUNTER — Encounter: Payer: Self-pay | Admitting: Dermatology

## 2020-11-20 LAB — CULTURE, GROUP A STREP (THRC)

## 2020-11-22 ENCOUNTER — Telehealth: Payer: Self-pay

## 2020-11-22 NOTE — Telephone Encounter (Signed)
-----   Message from Ralene Bathe, MD sent at 11/22/2020 10:48 AM EDT ----- Right medial cheek MELANOCYTIC NEVUS, INTRADERMAL TYPE, BASE INVOLVED  Benign mole No further treatment needed

## 2020-11-22 NOTE — Telephone Encounter (Signed)
Left pt message to call for bx results/sh °

## 2020-12-03 ENCOUNTER — Telehealth: Payer: Self-pay

## 2020-12-03 NOTE — Telephone Encounter (Signed)
Advised pt of bx result/sh ?

## 2020-12-03 NOTE — Telephone Encounter (Signed)
-----   Message from Ralene Bathe, MD sent at 11/22/2020 10:48 AM EDT ----- Right medial cheek MELANOCYTIC NEVUS, INTRADERMAL TYPE, BASE INVOLVED  Benign mole No further treatment needed

## 2021-03-01 ENCOUNTER — Encounter: Payer: Self-pay | Admitting: Emergency Medicine

## 2021-03-01 ENCOUNTER — Ambulatory Visit
Admission: EM | Admit: 2021-03-01 | Discharge: 2021-03-01 | Disposition: A | Discharge status: Home / Self Care | Payer: BC Managed Care – PPO | Attending: Emergency Medicine | Admitting: Emergency Medicine

## 2021-03-01 DIAGNOSIS — B349 Viral infection, unspecified: Secondary | ICD-10-CM | POA: Diagnosis not present

## 2021-03-01 LAB — POCT RAPID STREP A (OFFICE): Rapid Strep A Screen: NEGATIVE

## 2021-03-01 LAB — POCT INFLUENZA A/B
Influenza A, POC: NEGATIVE
Influenza B, POC: NEGATIVE

## 2021-03-01 MED ORDER — BENZONATATE 100 MG PO CAPS: 100.0000 mg | ORAL_CAPSULE | Freq: Three times a day (TID) | ORAL | 0 refills | Status: AC | PRN

## 2021-03-01 NOTE — ED Triage Notes (Signed)
Pt here with 2 days of flu-like sx with fever of 101 and fatigue.

## 2021-03-01 NOTE — ED Provider Notes (Addendum)
Roderic Palau    CSN: [id number] Arrival date & time: 03/01/21  0846      History   Chief Complaint Chief Complaint  Patient presents with   Fever   Fatigue   Generalized Body Aches   Nasal Congestion    HPI Gilbert Cook is a 35 y.o. male.  Patient presents with fever, fatigue, sore throat, nasal congestion, postnasal drip x 2 days.  He also reports occasional mild nonproductive cough.  T-max 101.  No rash, shortness of breath, vomiting, diarrhea, or other symptoms.  Treatment at home with OTC cold medication.  His medical history includes hypertension.  The history is provided by the patient and medical records.   Past Medical History:  Diagnosis Date   Hypercholesteremia     Patient Active Problem List   Diagnosis Date Noted   GERD without esophagitis 11/28/2017   Other hyperlipidemia 06/16/2016   Hypertension 05/23/2016   Palpitations 04/28/2016   Generalized anxiety disorder 03/18/2013   Encounter for preventive health examination 12/17/2012    Past Surgical History:  Procedure Laterality Date   CHOLECYSTECTOMY N/A 06/06/2015   Procedure: LAPAROSCOPIC CHOLECYSTECTOMY WITH INTRAOPERATIVE CHOLANGIOGRAM;  Surgeon: Jules Husbands, MD;  Location: [facility] ORS;  Service: General;  Laterality: N/A;   FRACTURE SURGERY Right 2013   wrist (Screw)       Home Medications    Prior to Admission medications   Medication Sig Start Date End Date Taking? Authorizing Provider  benzonatate (TESSALON) 100 MG capsule Take 1 capsule (100 mg total) by mouth 3 (three) times daily as needed for cough. 03/01/21  Yes Sharion Balloon, NP  amoxicillin-clavulanate (AUGMENTIN) 875-125 MG tablet Take 1 tablet by mouth every 12 (twelve) hours. 11/17/20   Hughie Closs, PA-C  aspirin EC 81 MG tablet Take 1 tablet (81 mg total) by mouth daily. 06/16/16   Wende Bushy, MD  busPIRone (BUSPAR) 10 MG tablet Take 1 tablet (10 mg total) by mouth 3 (three) times daily. As needed for  anxiety Patient not taking: No sig reported 01/05/18   Crecencio Mc, MD  famotidine (PEPCID) 20 MG tablet Take 1 tablet (20 mg total) by mouth at bedtime. 01/05/18   Crecencio Mc, MD  fluticasone (FLONASE) 50 MCG/ACT nasal spray Place 2 sprays into both nostrils daily. 11/17/20   Hughie Closs, PA-C  gabapentin (NEURONTIN) 300 MG capsule 1 po qHS x 4 days, then bid 10/21/20   [provider]  ibuprofen (ADVIL) 800 MG tablet Take 1 tablet (800 mg total) by mouth every 8 (eight) hours as needed for fever, headache or moderate pain. 07/28/20   Laurene Footman B, PA-C  metoprolol succinate (TOPROL-XL) 25 MG 24 hr tablet Take 1 tablet by mouth once daily 09/24/20   Crecencio Mc, MD  Misc. Devices MISC 1 wedge pillow for nightly use due to chronic acid reflux 01/27/17   Virgel Manifold, MD  Multiple Vitamin (MULTIVITAMIN) tablet Take 1 tablet by mouth daily.    [provider]  pantoprazole (PROTONIX) 40 MG tablet Take 1 tablet by mouth once daily 06/10/20   Crecencio Mc, MD    Family History Family History  Problem Relation Age of Onset   Hypertension Mother    Heart disease Mother    Hypertension Father    Cancer Father 21       T cell leukemia   Hypertension Maternal Grandmother    Hypertension Maternal Grandfather    Heart disease Maternal Grandfather  Heart attack Maternal Grandfather    Hypertension Paternal Grandmother    Hypertension Paternal Grandfather    Depression Maternal Uncle    Schizophrenia Maternal Uncle     Social History Social History   Tobacco Use   Smoking status: Former    Packs/day: 0.50    Years: 5.00    Pack years: 2.50    Types: Cigarettes    Quit date: 12/15/2010    Years since quitting: 10.2   Smokeless tobacco: Never  Vaping Use   Vaping Use: Never used  Substance Use Topics   Alcohol use: Yes   Drug use: Yes    Types: Marijuana     Allergies   Patient has no known allergies.   Review of Systems Review  of Systems  Constitutional:  Positive for fever. Negative for chills.  HENT:  Positive for congestion, postnasal drip and sore throat. Negative for ear pain.   Respiratory:  Positive for cough. Negative for shortness of breath.   Cardiovascular:  Negative for chest pain and palpitations.  Gastrointestinal:  Negative for diarrhea and vomiting.  Musculoskeletal:  Negative for back pain.  Skin:  Negative for color change and rash.  All other systems reviewed and are negative.   Physical Exam Triage Vital Signs ED Triage Vitals  Enc Vitals Group     BP 03/01/21 0958 126/82     Pulse Rate 03/01/21 0958 67     Resp 03/01/21 0958 20     Temp 03/01/21 0958 99.4 F (37.4 C)     Temp src --      SpO2 03/01/21 0958 97 %     Weight --      Height --      Head Circumference --      Peak Flow --      Pain Score 03/01/21 0959 0     Pain Loc --      Pain Edu? --      Excl. in [place]? --    No data found.  Updated Vital Signs BP 126/82    Pulse 67    Temp 99.4 F (37.4 C)    Resp 20    SpO2 97%   Visual Acuity Right Eye Distance:   Left Eye Distance:   Bilateral Distance:    Right Eye Near:   Left Eye Near:    Bilateral Near:     Physical Exam Vitals and nursing note reviewed.  Constitutional:      General: He is not in acute distress.    Appearance: Normal appearance. He is well-developed.  HENT:     Right Ear: Tympanic membrane normal.     Left Ear: Tympanic membrane normal.     Nose: Congestion and rhinorrhea present.     Mouth/Throat:     Mouth: Mucous membranes are moist.     Pharynx: Oropharyngeal exudate and posterior oropharyngeal erythema present.  Cardiovascular:     Rate and Rhythm: Normal rate and regular rhythm.     Heart sounds: Normal heart sounds.  Pulmonary:     Effort: Pulmonary effort is normal. No respiratory distress.     Breath sounds: Normal breath sounds.  Musculoskeletal:     Cervical back: Neck supple.  Skin:    General: Skin is warm and dry.   Neurological:     Mental Status: He is alert.  Psychiatric:        Mood and Affect: Mood normal.        Behavior: Behavior normal.  UC Treatments / Results  Labs (all labs ordered are listed, but only abnormal results are displayed) Labs Reviewed  NOVEL CORONAVIRUS, NAA  POCT INFLUENZA A/B  POCT RAPID STREP A (OFFICE)    EKG   Radiology No results found.  Procedures Procedures (including critical care time)  Medications Ordered in UC Medications - No data to display  Initial Impression / Assessment and Plan / UC Course  I have reviewed the triage vital signs and the nursing notes.  Pertinent labs & imaging results that were available during my care of the patient were reviewed by me and considered in my medical decision making (see chart for details).   Viral illness.  Rapid flu negative.  Rapid strep negative.  COVID pending.  Instructed patient to self quarantine per CDC guidelines.  Discussed symptomatic treatment including Tessalon Perles, Tylenol or ibuprofen, rest, hydration.  Instructed patient to follow up with PCP if symptoms are not improving.  Patient agrees to plan of care.    Final Clinical Impressions(s) / UC Diagnoses   Final diagnoses:  Viral illness     Discharge Instructions      Your flu test and strep test are negative.    Your COVID test is pending.  You should self quarantine until the test result is back.    Take Tylenol or ibuprofen as needed for fever or discomfort.  Rest and keep yourself hydrated.    Follow-up with your primary care provider if your symptoms are not improving.         ED Prescriptions     Medication Sig Dispense Auth. Provider   benzonatate (TESSALON) 100 MG capsule Take 1 capsule (100 mg total) by mouth 3 (three) times daily as needed for cough. 21 capsule Sharion Balloon, NP      PDMP not reviewed this encounter.   Sharion Balloon, NP 03/01/21 1027    Sharion Balloon, NP 03/01/21 1030

## 2021-03-01 NOTE — Discharge Instructions (Addendum)
Your flu test and strep test are negative.    Your COVID test is pending.  You should self quarantine until the test result is back.    Take Tylenol or ibuprofen as needed for fever or discomfort.  Rest and keep yourself hydrated.    Follow-up with your primary care provider if your symptoms are not improving.

## 2021-03-02 ENCOUNTER — Ambulatory Visit: Payer: Self-pay

## 2021-03-02 LAB — SARS-COV-2, NAA 2 DAY TAT

## 2021-03-02 LAB — NOVEL CORONAVIRUS, NAA: SARS-CoV-2, NAA: NOT DETECTED
